# Patient Record
Sex: Female | Born: 1960 | Race: White | Hispanic: No | Marital: Married | Smoking: Never smoker
Health system: Southern US, Community
[De-identification: ages and names within clinical notes are randomized; demographics above are authoritative.]

## PROBLEM LIST (undated history)

## (undated) DIAGNOSIS — R350 Frequency of micturition: Secondary | ICD-10-CM

## (undated) DIAGNOSIS — H109 Unspecified conjunctivitis: Secondary | ICD-10-CM

## (undated) DIAGNOSIS — E559 Vitamin D deficiency, unspecified: Principal | ICD-10-CM

## (undated) DIAGNOSIS — M8080XD Other osteoporosis with current pathological fracture, unspecified site, subsequent encounter for fracture with routine healing: Secondary | ICD-10-CM

## (undated) DIAGNOSIS — R300 Dysuria: Secondary | ICD-10-CM

## (undated) DIAGNOSIS — K529 Noninfective gastroenteritis and colitis, unspecified: Secondary | ICD-10-CM

## (undated) DIAGNOSIS — B3731 Acute candidiasis of vulva and vagina: Secondary | ICD-10-CM

## (undated) DIAGNOSIS — Z23 Encounter for immunization: Principal | ICD-10-CM

## (undated) DIAGNOSIS — Z803 Family history of malignant neoplasm of breast: Principal | ICD-10-CM

## (undated) DIAGNOSIS — C801 Malignant (primary) neoplasm, unspecified: Secondary | ICD-10-CM

## (undated) DIAGNOSIS — T7840XA Allergy, unspecified, initial encounter: Secondary | ICD-10-CM

## (undated) DIAGNOSIS — K5900 Constipation, unspecified: Secondary | ICD-10-CM

## (undated) DIAGNOSIS — M8580 Other specified disorders of bone density and structure, unspecified site: Secondary | ICD-10-CM

## (undated) DIAGNOSIS — D649 Anemia, unspecified: Secondary | ICD-10-CM

## (undated) HISTORY — PX: COLONOSCOPY: SHX174

## (undated) HISTORY — DX: Malignant (primary) neoplasm, unspecified: C80.1

## (undated) HISTORY — DX: Other specified disorders of bone density and structure, unspecified site: M85.80

## (undated) HISTORY — DX: Constipation, unspecified: K59.00

## (undated) HISTORY — DX: Allergy, unspecified, initial encounter: T78.40XA

## (undated) HISTORY — PX: WISDOM TOOTH EXTRACTION: SHX21

## (undated) HISTORY — PX: OTHER SURGICAL HISTORY: SHX169

## (undated) HISTORY — DX: Anemia, unspecified: D64.9

---

## 2016-12-16 ENCOUNTER — Other Ambulatory Visit: Payer: Self-pay | Admitting: Physician Assistant

## 2016-12-16 DIAGNOSIS — Z1231 Encounter for screening mammogram for malignant neoplasm of breast: Secondary | ICD-10-CM

## 2016-12-16 DIAGNOSIS — M810 Age-related osteoporosis without current pathological fracture: Secondary | ICD-10-CM

## 2017-01-02 ENCOUNTER — Ambulatory Visit
Admission: RE | Admit: 2017-01-02 | Discharge: 2017-01-02 | Disposition: A | Discharge status: Home / Self Care | Payer: BLUE CROSS/BLUE SHIELD | Source: Ambulatory Visit | Attending: Physician Assistant | Admitting: Physician Assistant

## 2017-01-02 DIAGNOSIS — Z1231 Encounter for screening mammogram for malignant neoplasm of breast: Secondary | ICD-10-CM

## 2017-01-02 DIAGNOSIS — M810 Age-related osteoporosis without current pathological fracture: Secondary | ICD-10-CM

## 2018-11-05 ENCOUNTER — Other Ambulatory Visit (HOSPITAL_COMMUNITY): Payer: Self-pay | Admitting: Physician Assistant

## 2018-11-05 ENCOUNTER — Other Ambulatory Visit: Payer: Self-pay | Admitting: Physician Assistant

## 2018-11-05 DIAGNOSIS — R1032 Left lower quadrant pain: Secondary | ICD-10-CM

## 2018-11-05 DIAGNOSIS — R1011 Right upper quadrant pain: Secondary | ICD-10-CM

## 2018-11-05 DIAGNOSIS — R197 Diarrhea, unspecified: Secondary | ICD-10-CM

## 2018-11-09 ENCOUNTER — Other Ambulatory Visit (HOSPITAL_COMMUNITY): Payer: Self-pay | Admitting: Physician Assistant

## 2018-11-09 DIAGNOSIS — R197 Diarrhea, unspecified: Secondary | ICD-10-CM

## 2018-11-12 ENCOUNTER — Ambulatory Visit (HOSPITAL_COMMUNITY)
Admission: RE | Admit: 2018-11-12 | Discharge: 2018-11-12 | Disposition: A | Discharge status: Home / Self Care | Payer: BLUE CROSS/BLUE SHIELD | Source: Ambulatory Visit | Attending: Physician Assistant | Admitting: Physician Assistant

## 2018-11-12 ENCOUNTER — Other Ambulatory Visit: Payer: Self-pay

## 2018-11-12 DIAGNOSIS — R197 Diarrhea, unspecified: Secondary | ICD-10-CM | POA: Diagnosis present

## 2019-01-31 ENCOUNTER — Other Ambulatory Visit: Payer: Self-pay | Admitting: Obstetrics and Gynecology

## 2019-01-31 DIAGNOSIS — Z803 Family history of malignant neoplasm of breast: Secondary | ICD-10-CM

## 2019-02-02 ENCOUNTER — Ambulatory Visit
Admission: RE | Admit: 2019-02-02 | Discharge: 2019-02-02 | Disposition: A | Discharge status: Home / Self Care | Payer: BLUE CROSS/BLUE SHIELD | Source: Ambulatory Visit | Attending: Obstetrics and Gynecology | Admitting: Obstetrics and Gynecology

## 2019-02-02 DIAGNOSIS — Z803 Family history of malignant neoplasm of breast: Secondary | ICD-10-CM

## 2019-02-02 MED ORDER — GADOBUTROL 1 MMOL/ML IV SOLN
8.0000 mL | Freq: Once | INTRAVENOUS | Status: AC | PRN
  Administered 2019-02-02: 8 mL via INTRAVENOUS

## 2020-01-11 ENCOUNTER — Encounter: Payer: Self-pay | Admitting: Gastroenterology

## 2020-03-07 ENCOUNTER — Other Ambulatory Visit: Payer: Self-pay

## 2020-03-07 ENCOUNTER — Ambulatory Visit (AMBULATORY_SURGERY_CENTER): Payer: Self-pay | Admitting: *Deleted

## 2020-03-07 ENCOUNTER — Encounter: Payer: Self-pay | Admitting: Gastroenterology

## 2020-03-07 VITALS — Ht 65.0 in | Wt 205.0 lb

## 2020-03-07 DIAGNOSIS — Z1211 Encounter for screening for malignant neoplasm of colon: Secondary | ICD-10-CM

## 2020-03-07 NOTE — Progress Notes (Signed)
cov vax x 2  No egg or soy allergy known to patient  No issues with past sedation with any surgeries or procedures no intubation problems in the past  No FH of Malignant Hyperthermia No diet pills per patient No home 02 use per patient  No blood thinners per patient  Pt states issues with constipation  - long hx- has a BM EOD-  States are typically hard stools -nothing OTC to help - did a 2 day Miralax prep No A fib or A flutter  EMMI video to pt  COVID 19 guidelines implemented in [facility] today with Pt and RN    Due to the COVID-19 pandemic we are asking patients to follow these guidelines. Please only bring one care partner. Please be aware that your care partner may wait in the car in the parking lot or if they feel like they will be too hot to wait in the car, they may wait in the lobby on the 4th floor. All care partners are required to wear a mask the entire time (we do not have any that we can provide them), they need to practice social distancing, and we will do a Covid check for all patient's and care partners when you arrive. Also we will check their temperature and your temperature. If the care partner waits in their car they need to stay in the parking lot the entire time and we will call them on their cell phone when the patient is ready for discharge so they can bring the car to the front of the building. Also all patient's will need to wear a mask into building.

## 2020-03-21 ENCOUNTER — Ambulatory Visit (AMBULATORY_SURGERY_CENTER): Payer: BC Managed Care – PPO | Admitting: Gastroenterology

## 2020-03-21 ENCOUNTER — Encounter: Payer: Self-pay | Admitting: Gastroenterology

## 2020-03-21 ENCOUNTER — Other Ambulatory Visit: Payer: Self-pay

## 2020-03-21 VITALS — BP 103/82 | HR 69 | Temp 97.5°F | Resp 8 | Ht 65.0 in | Wt 205.0 lb

## 2020-03-21 DIAGNOSIS — Z1211 Encounter for screening for malignant neoplasm of colon: Secondary | ICD-10-CM

## 2020-03-21 MED ORDER — SODIUM CHLORIDE 0.9 % IV SOLN: 500.0000 mL | Freq: Once | INTRAVENOUS | Status: DC

## 2020-03-21 NOTE — Progress Notes (Signed)
PT taken to PACU. Monitors in place. VSS. Report given to RN. 

## 2020-03-21 NOTE — Patient Instructions (Signed)

## 2020-03-21 NOTE — Progress Notes (Signed)
Pt's states no medical or surgical changes since previsit or office visit. 

## 2020-03-21 NOTE — Op Note (Signed)
[facility] Patient Name: Heidi Watkins Procedure Date: 03/21/2020 8:04 AM MRN: [id number] Endoscopist: Milus Banister , MD Age: 59 Referring MD:  Date of Birth: [birth year] Gender: Female Account #: [id number] Procedure:                Colonoscopy Indications:              Screening for colorectal malignant neoplasm Medicines:                Monitored Anesthesia Care Procedure:                Pre-Anesthesia Assessment:                           - Prior to the procedure, a History and Physical                            was performed, and patient medications and                            allergies were reviewed. The patient's tolerance of                            previous anesthesia was also reviewed. The risks                            and benefits of the procedure and the sedation                            options and risks were discussed with the patient.                            All questions were answered, and informed consent                            was obtained. Prior Anticoagulants: The patient has                            taken no previous anticoagulant or antiplatelet                            agents. ASA Grade Assessment: II - A patient with                            mild systemic disease. After reviewing the risks                            and benefits, the patient was deemed in                            satisfactory condition to undergo the procedure.                           After obtaining informed consent, the colonoscope  was passed under direct vision. Throughout the                            procedure, the patient's blood pressure, pulse, and                            oxygen saturations were monitored continuously. The                            Colonoscope was introduced through the anus and                            advanced to the the cecum, identified by                            appendiceal orifice and  ileocecal valve. The                            colonoscopy was performed without difficulty. The                            patient tolerated the procedure well. The quality                            of the bowel preparation was good. The ileocecal                            valve, appendiceal orifice, and rectum were                            photographed. Scope In: 8:07:52 AM Scope Out: 8:21:27 AM Scope Withdrawal Time: 0 hours 7 minutes 2 seconds  Total Procedure Duration: 0 hours 13 minutes 35 seconds  Findings:                 The entire examined colon appeared normal on direct                            and retroflexion views. Complications:            No immediate complications. Estimated blood loss:                            None. Estimated Blood Loss:     Estimated blood loss: none. Impression:               - The entire examined colon is normal on direct and                            retroflexion views.                           - No polyps or cancers. Recommendation:           - Patient has a contact number available for  emergencies. The signs and symptoms of potential                            delayed complications were discussed with the                            patient. Return to normal activities tomorrow.                            Written discharge instructions were provided to the                            patient.                           - Resume previous diet.                           - Continue present medications.                           - Repeat colonoscopy in 10 years for screening. Milus Banister, MD 03/21/2020 8:24:41 AM This report has been signed electronically.

## 2020-03-23 ENCOUNTER — Telehealth: Payer: Self-pay | Admitting: *Deleted

## 2020-03-23 NOTE — Telephone Encounter (Signed)
1. Have you developed a fever since your procedure? no  2.   Have you had an respiratory symptoms (SOB or cough) since your procedure? no  3.   Have you tested positive for COVID 19 since your procedure no  4.   Have you had any family members/close contacts diagnosed with the COVID 19 since your procedure?  no   If yes to any of these questions please route to Joylene John, RN and Joella Prince, RN Follow up Call-  Call back number 03/21/2020  Post procedure Call Back phone  # [phone]  Permission to leave phone message Yes  Some recent data might be hidden     Patient questions:  Do you have a fever, pain , or abdominal swelling? No. Pain Score  0 *  Have you tolerated food without any problems? Yes.    Have you been able to return to your normal activities? Yes.    Do you have any questions about your discharge instructions: Diet   No. Medications  No. Follow up visit  No.  Do you have questions or concerns about your Care? No.  Actions: * If pain score is 4 or above: No action needed, pain <4.

## 2021-10-23 ENCOUNTER — Ambulatory Visit
Admit: 2021-10-23 | Discharge: 2021-10-23 | Payer: PRIVATE HEALTH INSURANCE | Attending: Internal Medicine | Primary: Internal Medicine

## 2021-10-23 DIAGNOSIS — K5901 Slow transit constipation: Secondary | ICD-10-CM

## 2021-10-23 NOTE — Assessment & Plan Note (Signed)
She had a history of macroglossia.  Tongue currently looks fairly normal in size.  No glossitis noted.  Check a B12.

## 2021-10-23 NOTE — Assessment & Plan Note (Signed)
Discussed the importance of stool softeners and fiber.  Discussed natural options such as increasing fiber his fruit intake and prunes.  Can also use MiraLAX and Metamucil.  Discussed the importance of avoiding constipation to reduce the chances of diverticulosis/diverticulitis as well as internal hemorrhoids.

## 2021-10-23 NOTE — Progress Notes (Signed)
HPI   Chief Complaint  Chief Complaint   Patient presents with    New Patient     Her husband comes to see you as well. They just moved from [place], [place].     Referral - General     Would like a referral for a dermatologist, has had a HX of skin cancer & also has a red rash that appeared when she moved here.    Blood Work       HISTORY OF PRESENT ILLNESS  61 y.o. female presents today as new pt:  61 year old female presents today to establish as a new patient.  I also take care for her husband John Parrilla.  Patient has a strong family history of breast cancer with 2 sisters who had breast cancer as well as her mother.  Patient states she tested for BRCA 2020 -negative.    Last mammogram - 2020 seeing [facility].  She also had a breast MRI in 2020 that was normal.  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  She has numerous moles and spots that she was previously being evaluated and followed by dermatology.  She is also had a lesion on her right lower eyelid that was removed in the past seems to be reoccurring at this time.  Patient's only other concern at this time is continued weight gain.  She is trying to exercise on a regular basis and eat well but she still continued to gain weight.  She occasionally has constipation as well and intermittently uses Dulcolax.  We discussed using MiraLAX and/or fiber supplementation such as Metamucil.  She states in general she does well but she works as a Engineer, maintenance (IT) and during tax season she is noted to have increased constipation but it is likely related to decreased water intake.    Last GYN exam was in 2021 with a normal Pap smear.  Unclear if she was tested for HPV at that time.    Last colonoscopy in 2021.  10-year follow-up.  Assessment & Plan    ASSESSMENT AND PLAN    1. Slow transit constipation  2. 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 Vitamin deficiency  -     Vitamin D  25 Hydroxy; Future  4. Hyperglycemia  -     Hemoglobin A1C; Future  5. Other specified disorders of bone density and structure, other site  -     DEXA BONE DENSITY AXIAL SKELETON; Future  6. Colon cancer screening  7. Breast cancer screening by mammogram  -     MAM TOMO DIGITAL SCREEN BILATERAL; Future  8. Routine medical exam  -     TSH with Reflex; Future  -     Lipid Panel; Future  -     Comprehensive Metabolic Panel; Future  9. Urinary frequency  -     Urinalysis W/ Rflx Microscopic; Future  10. Rosacea  -     Amb External Referral To Dermatology  11. Other seborrheic keratosis  -     Amb External Referral To Dermatology  12. Eye exam, routine  -     External Referral To Optometry     Obtaining routine blood work for physical.  Will return in 6 months for her physical.    PHQ Scores 10/23/2021   PHQ2 Score 0   PHQ9 Score 0  Interpretation of Total Score Depression Severity: 1-4 = Minimal depression, 5-9 = Mild depression, 10-14 = Moderate depression, 15-19 = Moderately severe depression, 20-27 = Severe depression   The diagnoses and plan were discussed with the patient, who verbalizes understanding and agrees with plan.  All questions answered.      There are no Patient Instructions on file for this visit.    Active medications at end of Visit  No current outpatient medications on file.     No current facility-administered medications for this visit.          No follow-ups on file.       REVIEW OF SYSTEMS  Review of Systems   Constitutional:  Positive for fatigue and unexpected weight change (wt gain).   HENT:  Negative for nosebleeds, postnasal drip and rhinorrhea.    Respiratory:  Negative for chest tightness and shortness of breath.    Gastrointestinal:  Positive for constipation.   Endocrine: Negative for cold intolerance.   Genitourinary:  Negative for dysuria.   Neurological:  Negative for dizziness and syncope.   Psychiatric/Behavioral:  Negative for agitation, behavioral problems and confusion.        PHYSICAL EXAMINATION  Vitals:    10/23/21 1509 10/23/21 1545   BP: (!) 144/83 130/84   Pulse: 60    SpO2: 97%    Weight: 210 lb 9.6 oz (95.5 kg)    Height: 5' 5"  (1.651 m)      Body mass index is 35.05 kg/m.    Physical Exam  Constitutional:       Appearance: Normal appearance.   HENT:      Head: Normocephalic and atraumatic.      Right Ear: Tympanic membrane and external ear normal.      Left Ear: Tympanic membrane and external ear normal.      Nose: Nose normal.      Mouth/Throat:      Mouth: Mucous membranes are moist.      Pharynx: Oropharynx is clear.   Eyes:      Conjunctiva/sclera: Conjunctivae normal.      Pupils: Pupils are equal, round, and reactive to light.   Cardiovascular:      Rate and Rhythm: Normal rate and regular rhythm.      Pulses: Normal pulses.   Pulmonary:      Effort: Pulmonary effort is normal.      Breath sounds: Normal breath sounds.   Abdominal:      General: Abdomen is flat. Bowel sounds are normal.      Palpations: Abdomen is soft.   Musculoskeletal:         General: Normal range of motion.      Cervical back: Normal range of motion.      Right lower leg: No edema.      Left lower leg: No edema.   Skin:     General: Skin is warm and dry.      Capillary Refill: Capillary refill takes less than 2 seconds.   Neurological:      General: No focal deficit present.      Mental Status: She is alert and oriented to person, place, and time. Mental status is at baseline.   Psychiatric:         Mood and Affect: Mood normal.         Behavior: Behavior normal.         Thought Content: Thought content normal.       MEDICATIONS at [facility]  of Visit  No current outpatient medications on file prior to visit.     No current facility-administered medications on file prior to visit.       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       PAST SURGICAL HISTORY  Past Surgical History:   Procedure Laterality Date    MOHS SURGERY  2018       FAMILY HISTORY  Family History   Problem Relation Age of Onset    Breast Cancer Mother 36    Macular Degen Father     Diabetes type 2  Father     Heart Failure Father     Breast Cancer Sister 46    Breast Cancer Sister 58    Bone Cancer Maternal Grandfather     Cerebral Aneurysm Paternal Grandmother        SOCIAL HISTORY  Social History     Socioeconomic History    Marital status: Married     Spouse name: None    Number of children: None    Years of education: None    Highest education level: None   Tobacco Use    Smoking status: Never    Smokeless tobacco: Never   Vaping Use    Vaping Use: Never used   Substance and Sexual Activity    Alcohol use: Not Currently    Drug use: Not Currently    Sexual activity: Yes     Partners: Male

## 2021-10-23 NOTE — Assessment & Plan Note (Signed)
Referral to dermatology for follow-up.  Has only had 1 skin cancer removed.  Also has had a lesion under her right eyelid removed.

## 2021-10-25 ENCOUNTER — Encounter

## 2021-10-25 LAB — CBC
Hematocrit: 39.5 % (ref 34.0–47.0)
Hemoglobin: 13.3 g/dL (ref 11.5–15.7)
MCH: 31.9 pg (ref 27.0–34.5)
MCHC: 33.7 g/dL (ref 32.0–36.0)
MCV: 94.7 fL (ref 81.0–99.0)
MPV: 11.8 fL (ref 7.2–13.2)
NRBC Absolute: 0 10*3/uL (ref 0.000–0.012)
NRBC Automated: 0 % (ref 0.0–0.2)
Platelets: 246 10*3/uL (ref 140–440)
RBC: 4.17 x10e6/mcL (ref 3.60–5.20)
RDW: 13.4 % (ref 11.0–16.0)
WBC: 5.1 10*3/uL (ref 3.8–10.6)

## 2021-10-25 LAB — URINALYSIS W/ RFLX MICROSCOPIC
Bilirubin Urine: NEGATIVE
Blood, Urine: NEGATIVE
Glucose, UA: NEGATIVE
Ketones, Urine: NEGATIVE
Leukocyte Esterase, Urine: NEGATIVE
Nitrite, Urine: NEGATIVE
Protein, UA: NEGATIVE
Specific Gravity, UA: 1.015 (ref 1.003–1.035)
Urobilinogen, Urine: 0.2 EU/dL
pH, UA: 7 (ref 4.5–8.0)

## 2021-10-25 LAB — LIPID PANEL
Chol/HDL Ratio: 3.2 (ref 0.0–4.4)
Cholesterol: 193 mg/dL (ref 100–200)
HDL: 60 mg/dL (ref >=50)
LDL Cholesterol: 124.2 mg/dL — ABNORMAL HIGH (ref 0.0–100.0)
LDL/HDL Ratio: 2.1
Triglycerides: 44 mg/dL (ref 0–149)
VLDL: 8.8 mg/dL (ref 5.0–40.0)

## 2021-10-25 LAB — COMPREHENSIVE METABOLIC PANEL
ALT: 17 U/L (ref 0–35)
AST: 18 U/L (ref 0–35)
Albumin/Globulin Ratio: 1.4 (ref 1.00–2.70)
Albumin: 3.8 g/dL (ref 3.5–5.2)
Alk Phosphatase: 93 U/L (ref 35–117)
Anion Gap: 9 mmol/L (ref 2–17)
BUN: 11 mg/dL (ref 8–23)
CO2: 24 mmol/L (ref 22–29)
Calcium: 9 mg/dL (ref 8.8–10.2)
Chloride: 108 mmol/L — ABNORMAL HIGH (ref 98–107)
Creatinine: 0.6 mg/dL (ref 0.5–1.0)
Est, Glom Filt Rate: 101 mL/min/1.73m (ref >=60)
Globulin: 2.7 g/dL (ref 1.9–4.4)
Glucose: 100 mg/dL — ABNORMAL HIGH (ref 70–99)
OSMOLALITY CALCULATED: 281 mOsm/kg (ref 270–287)
Potassium: 4.2 mmol/L (ref 3.5–5.3)
Sodium: 141 mmol/L (ref 135–145)
Total Bilirubin: 0.3 mg/dL (ref 0.00–1.20)
Total Protein: 6.5 g/dL (ref 6.4–8.3)

## 2021-10-25 LAB — HEMOGLOBIN A1C
Est. Avg. Glucose, WB: 114
Est. Avg. Glucose-calculated: 122
Hemoglobin A1C: 5.6 % (ref 4.0–6.0)

## 2021-10-25 LAB — VITAMIN D 25 HYDROXY: Vit D, 25-Hydroxy: 26 ng/mL — ABNORMAL LOW (ref 30.0–90.0)

## 2021-10-25 LAB — TSH WITH REFLEX: TSH: 1.35 mcIU/mL (ref 0.358–3.740)

## 2021-10-28 NOTE — Other (Signed)
Please update patient.    Labs overall look good.  LDL minimally elevated.  Continue with a heart healthy diet.  Recommend starting vitamin D supplementation as listed below.  CMP: Within normal limits.  Lipids: Within normal limits.  A great HDL at 60 and a stable LDL at 124.  Vitamin D level is low.  Recommend vitamin D 2000 units or 50 mcg daily.  TSH within normal limits  A1c within normal limits  UA negative.  CBC within normal limits

## 2021-11-04 ENCOUNTER — Ambulatory Visit
Admit: 2021-11-04 | Discharge: 2021-11-04 | Payer: PRIVATE HEALTH INSURANCE | Attending: Family | Primary: Internal Medicine

## 2021-11-04 DIAGNOSIS — S3992XA Unspecified injury of lower back, initial encounter: Secondary | ICD-10-CM

## 2021-11-04 MED ORDER — PREDNISONE 10 MG PO TABS
10 MG | ORAL_TABLET | ORAL | 0 refills | Status: AC
Start: 2021-11-04 — End: 2021-11-16

## 2021-11-04 MED ORDER — PREDNISONE 10 MG PO TABS
10 MG | ORAL_TABLET | ORAL | 0 refills | Status: DC
Start: 2021-11-04 — End: 2021-11-04

## 2021-11-04 NOTE — Progress Notes (Unsigned)
CHIEF COMPLAINT:  Chief Complaint   Patient presents with    Lower Back Pain     Patient presents today with complaints of low back pain after trying to straighten the leg of a lounge chair and hearing a pop 1 week ago.        HISTORY OF PRESENT ILLNESS:  Julie Garner is a 61 y.o. female  who presents   Aleve, back brace, rest, ice once  Chiropractor in [place], 3 years ago had imaging, scoliosis.     Back Pain  This is a new problem. The current episode started in the past 7 days. The problem occurs constantly. The problem is unchanged. The pain is present in the lumbar spine. The quality of the pain is described as aching. The pain does not radiate. The pain is moderate. The pain is The same all the time. The symptoms are aggravated by twisting. Stiffness is present All day. Associated symptoms include abdominal pain. Pertinent negatives include no bladder incontinence, bowel incontinence, chest pain, dysuria, fever, headaches, leg pain, numbness, pelvic pain, tingling, weakness or weight loss. Risk factors include menopause and history of osteoporosis. She has tried bed rest and NSAIDs (back brace) for the symptoms. The treatment provided moderate relief.      PHQ:  PHQ-9  10/23/2021   Little interest or pleasure in doing things 0   Feeling down, depressed, or hopeless 0   PHQ-2 Score 0   PHQ-9 Total Score 0       CURRENT MEDICATION LIST:    Prior to Admission medications    Medication Sig Start Date End Date Taking? Authorizing Provider   Cholecalciferol (VITAMIN D3) 50 MCG (2000 UT) CAPS Take by mouth   Yes Historical Provider, MD   metroNIDAZOLE (METROCREAM) 0.75 % cream Apply TO affected AREA 1-2 times a DAY 10/28/21   Historical Provider, MD   doxycycline hyclate (VIBRA-TABS) 100 MG tablet Take 1/2 tablet BY MOUTH with food EVERY DAY 10/28/21   Historical Provider, MD   predniSONE (DELTASONE) 10 MG tablet Take 4 tablets by mouth daily for 3 days, THEN 3 tablets daily for 3 days, THEN 2 tablets daily for 3 days,  THEN 1 tablet daily for 3 days. 11/04/21 11/16/21 Yes [place], APRN - NP           I have reviewed and reconciled the above medication list during today's office visit.     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      Past Surgical History:   Procedure Laterality Date    MOHS SURGERY  2018      Family History   Problem Relation Age of Onset    Breast Cancer Mother 56    Macular Degen Father     Diabetes type 2  Father     Heart Failure Father     Breast Cancer Sister 21    Breast Cancer Sister 39    Bone Cancer Maternal Grandfather     Cerebral Aneurysm Paternal Grandmother       Social History     Socioeconomic History    Marital status: Married     Spouse name: Not on file    Number of children: Not on file  Years of education: Not on file    Highest education level: Not on file   Occupational History    Not on file   Tobacco Use    Smoking status: Never    Smokeless tobacco: Never   Vaping Use    Vaping Use: Never used   Substance and Sexual Activity    Alcohol use: Not Currently    Drug use: Not Currently    Sexual activity: Yes     Partners: Male   Other Topics Concern    Not on file   Social History Narrative    Not on file     Social Determinants of Health     Financial Resource Strain: Not on file   Food Insecurity: Not on file   Transportation Needs: Not on file   Physical Activity: Not on file   Stress: Not on file   Social Connections: Not on file   Intimate Partner Violence: Not on file   Housing Stability: Not on file        REVIEW OF SYSTEMS:  Review of Systems   Constitutional:  Negative for fever and weight loss.   Cardiovascular:  Negative for chest pain.   Gastrointestinal:  Positive for abdominal pain. Negative for bowel incontinence.   Genitourinary:  Negative for bladder incontinence, dysuria and pelvic  pain.   Musculoskeletal:  Positive for back pain.   Neurological:  Negative for tingling, weakness, numbness and headaches.      PHYSICAL EXAM:    Vital Signs -   Vitals:    11/04/21 1632   BP: 139/73   Pulse: 69   Temp: 98.1 F (36.7 C)   SpO2: 98%   Weight: 203 lb (92.1 kg)   Height: 5' 5"  (1.651 m)        Physical Exam  Vitals reviewed.   Constitutional:       Appearance: Normal appearance.   HENT:      Head: Normocephalic and atraumatic.      Nose: Nose normal.      Mouth/Throat:      Mouth: Mucous membranes are moist.   Eyes:      Conjunctiva/sclera: Conjunctivae normal.   Cardiovascular:      Rate and Rhythm: Normal rate and regular rhythm.      Pulses: Normal pulses.      Heart sounds: Normal heart sounds.   Pulmonary:      Effort: Pulmonary effort is normal.      Breath sounds: Normal breath sounds.   Abdominal:      Palpations: Abdomen is soft.   Musculoskeletal:         General: Tenderness present.      Cervical back: Neck supple.      Lumbar back: Tenderness present. Decreased range of motion.   Skin:     General: Skin is warm.   Neurological:      Mental Status: She is alert and oriented to person, place, and time.   Psychiatric:         Mood and Affect: Mood normal.         Behavior: Behavior normal.        LABS  No results found for this visit on 11/04/21.  Orders Only on 10/25/2021   Component Date Value Ref Range Status    Color, UA 10/25/2021 Yellow   Final    Appearance 10/25/2021 Clear   Final    Glucose, UA 10/25/2021 Negative  Negative Final    Bilirubin  Urine 10/25/2021 Negative  Negative Final    Ketones, Urine 10/25/2021 Negative  Negative Final    Specific Gravity, UA 10/25/2021 1.015  1.003 - 1.035 Final    Blood, Urine 10/25/2021 Negative  Negative Final    pH, UA 10/25/2021 7.0  4.5 - 8.0 Final    Protein, UA 10/25/2021 Negative  Negative Final    Urobilinogen, Urine 10/25/2021 0.2  1.0 EU/dL Final    Nitrite, Urine 10/25/2021 Negative  Negative Final    Leukocyte Esterase, Urine  10/25/2021 Negative  Negative Final    Vit D, 25-Hydroxy 10/25/2021 26.0 (L)  30.0 - 90.0 ng/mL Final    Hemoglobin A1C 10/25/2021 5.6  4.0 - 6.0 % Final    Comment: HEMOGLOBIN A1C INTERPRETATION:    The following arbitrary ranges may be used for interpretation of the results.  However, factors such as duration of diabetes, adherence to therapy, and  patient age should also be considered in assessing degree of blood glucose  control.    Hemoglobin A1C                 Avg. Blood Sugar  --------------------------------------------------------------  6%                           135 mg/dL  7%                           170 mg/dL  8%                           205 mg/dL  9%                           240 mg/dL  10%                          275 mg/dL    ======================================================    A1C                      Glucose Control  ----------------------------------------------------------------  < 6.0 %                   Normal  6.0 - 6.9 %               Abnormal  7.0 - 7.9 %               Sub-Optimal Control  > 8.0 %                   Inadequate Control      Est. Avg. Glucose, WB 10/25/2021 114   Final    Est. Avg. Glucose-calculated 10/25/2021 122   Final    WBC 10/25/2021 5.1  3.8 - 10.6 x10e3/mcL Final    RBC 10/25/2021 4.17  3.60 - 5.20 x10e6/mcL Final    Hemoglobin 10/25/2021 13.3  11.5 - 15.7 g/dL Final    Hematocrit 10/25/2021 39.5  34.0 - 47.0 % Final    MCV 10/25/2021 94.7  81.0 - 99.0 fL Final    MCH 10/25/2021 31.9  27.0 - 34.5 pg Final    MCHC 10/25/2021 33.7  32.0 - 36.0 g/dL Final    RDW 10/25/2021 13.4  11.0 - 16.0 % Final    Platelets 10/25/2021 246  140 - 440 x10e3/mcL Final  MPV 10/25/2021 11.8  7.2 - 13.2 fL Final    NRBC Automated 10/25/2021 0.0  0.0 - 0.2 % Final    NRBC Absolute 10/25/2021 0.000  0.000 - 0.012 x10e3/mcL Final    Sodium 10/25/2021 141  135 - 145 mmol/L Final    Potassium 10/25/2021 4.2  3.5 - 5.3 mmol/L Final    Chloride 10/25/2021 108 (H)  98 - 107 mmol/L Final     CO2 10/25/2021 24  22 - 29 mmol/L Final    Glucose 10/25/2021 100 (H)  70 - 99 mg/dL Final    BUN 10/25/2021 11  8 - 23 mg/dL Final    Creatinine 10/25/2021 0.6  0.5 - 1.0 mg/dL Final    Anion Gap 10/25/2021 9  2 - 17 mmol/L Final    OSMOLALITY CALCULATED 10/25/2021 281  270 - 287 mOsm/kg Final    Calcium 10/25/2021 9.0  8.8 - 10.2 mg/dL Final    Total Protein 10/25/2021 6.5  6.4 - 8.3 g/dL Final    Albumin 10/25/2021 3.8  3.5 - 5.2 g/dL Final    Globulin 10/25/2021 2.7  1.9 - 4.4 g/dL Final    Albumin/Globulin Ratio 10/25/2021 1.40  1.00 - 2.70 Final    Total Bilirubin 10/25/2021 0.30  0.00 - 1.20 mg/dL Final    Alk Phosphatase 10/25/2021 93  35 - 117 unit/L Final    AST 10/25/2021 18  0 - 35 unit/L Final    ALT 10/25/2021 17  0 - 35 unit/L Final    Est, Glom Filt Rate 10/25/2021 101  >=60 mL/min/1.56mFinal    Comment: VERIFIED by Discern Expert.  GFR Interpretation:                                                                         % OF  KIDNEY  GFR                                                        STAGE  FUNCTION  ==================================================================================    > 90        Normal kidney function                       STAGE 1  90-100%  89 to 60      Mild loss of kidney function                 STAGE 2  80-60%  59 to 45      Mild to moderate loss of kidney function     STAGE 3a  59-45%  44 to 30      Moderate to severe loss of kidney function   STAGE 3b  44-30%  29 to 15      Severe loss of kidney function               STAGE 4  29-15%    < 15        Kidney failure  STAGE 5  <15%  ==================================================================================  Modified from [place]    GFR Calculation performed using the CKD-EPI 2021 equation developed for use  with IDMS traceable creatinine methods and                            is the calculation recommended by  the [place] for estimating GFR  in adults.      Cholesterol 10/25/2021 193  100 - 200 mg/dL Final    Comment: The National Cholesterol Education Program has published reference  cholesterol values for cardiovascular risk to be:    Less than 200 mg/dL     = Low Risk    200 to 239 mg/dL        = Borderline Risk    241m/dL and greater    = High Risk      HDL 10/25/2021 60  >=50 mg/dL Final    Comment: The National Lipid Association and the [employer]olesterol Education Program  (NCEP) have set the guidelines for high-density lipoprotein (HDL) cholesterol  in adults ages 120and up.      Triglycerides 10/25/2021 44  0 - 149 mg/dL Final    Comment:   TRIGLYCERIDE INTERPRETATION:                          Recommended Fasting Triglyc Levels for Adults                        =============================================                        Desirable                        < 150 mg/dL                        Average                          < 200 mg/dL                        Borderline High             200 to 500 mg/dL                        Hypertriglyceridemic             > 500 mg/dL                        =============================================      LDL Cholesterol 10/25/2021 124.2 (H)  0.0 - 100.0 mg/dL Final    LDL/HDL Ratio 10/25/2021 2.1   Final    Chol/HDL Ratio 10/25/2021 3.2  0.0 - 4.4 Final    VLDL 10/25/2021 8.8  5.0 - 40.0 mg/dL Final    TSH 10/25/2021 1.350  0.358 - 3.740 mcIU/mL Final    Comment: TSH INTERPRETATION:    Controversy exists over an acceptable TSH range. Some experts argue that a  narrower reference range is better and will increase the detection of thyroid  disease, particularly marginal hypothyroidism.         I have reviewed the above labs with the patient during today's  office visit.    IMPRESSION/PLAN    1. Back injury, initial encounter  -     XR LUMBAR SPINE (2-3 VIEWS); Future  -     predniSONE (DELTASONE) 10 MG tablet; Take 4 tablets by mouth daily for 3 days, THEN 3 tablets daily for 3 days, THEN 2 tablets  daily for 3 days, THEN 1 tablet daily for 3 days., Disp-30 tablet, R-0Normal  2. Acute bilateral low back pain without sciatica  -     XR LUMBAR SPINE (2-3 VIEWS); Future  -     predniSONE (DELTASONE) 10 MG tablet; Take 4 tablets by mouth daily for 3 days, THEN 3 tablets daily for 3 days, THEN 2 tablets daily for 3 days, THEN 1 tablet daily for 3 days., Disp-30 tablet, R-0Normal     The patient is advised to apply ice or cold packs intermittently as needed to relieve pain.       Follow up and Dispositions:  Return if symptoms worsen or fail to improve.       Jodie Echevaria, FNP-C

## 2021-11-04 NOTE — Telephone Encounter (Signed)
Julie Garner

## 2021-11-04 NOTE — Telephone Encounter (Addendum)
Patient states that she hurt the lower part of her back a week ago from Saturday. She states that it popped while she was turning a lounge chair over with her foot. She states that it didn't hurt right then, but it did ever since that day. She's been taking aleve. Please call and advise     On board for Dr.thompson and sent to [facility]

## 2021-11-05 ENCOUNTER — Ambulatory Visit: Admit: 2021-11-05 | Discharge: 2021-11-05 | Payer: PRIVATE HEALTH INSURANCE | Primary: Internal Medicine

## 2021-11-05 DIAGNOSIS — S3992XA Unspecified injury of lower back, initial encounter: Secondary | ICD-10-CM

## 2021-11-15 NOTE — Telephone Encounter (Signed)
Looks like Julie Garner ordered an XR of her LS spine, and informed her of the L1 compression deformity. Then put in a referral to [facility] for her.     I called to see if she had heard from their office, she had not and I gave her the office number for Dr [facility]'s office. She will call, let them know she has a referral and see about getting scheduled.

## 2021-11-15 NOTE — Telephone Encounter (Signed)
Patient has had some back problems and was diagnosed with having an L1 compression deformity. She would like to know if a referral can be put in for her to a specialty doctor who can help her.     On board for Dr. Harvel Quale to [facility]

## 2021-12-03 ENCOUNTER — Inpatient Hospital Stay: Admit: 2021-12-03 | Payer: PRIVATE HEALTH INSURANCE | Primary: Internal Medicine

## 2021-12-03 DIAGNOSIS — Z1231 Encounter for screening mammogram for malignant neoplasm of breast: Secondary | ICD-10-CM

## 2021-12-03 DIAGNOSIS — M8588 Other specified disorders of bone density and structure, other site: Secondary | ICD-10-CM

## 2021-12-03 NOTE — Other (Signed)
Please call update and schedule the patient.      Her bone density test shows significant osteopenia however with her recent L1 compression fracture I think she needs to be treated for osteoporosis.  We can schedule her for a follow-up visit to discuss treatment options.  Also need to check a vitamin D level on her.  She will need vitamin D and calcium supplementation likely.

## 2021-12-04 ENCOUNTER — Encounter

## 2021-12-06 ENCOUNTER — Encounter

## 2021-12-06 LAB — VITAMIN D 25 HYDROXY: Vit D, 25-Hydroxy: 27.4 ng/mL — ABNORMAL LOW (ref 30.0–90.0)

## 2021-12-09 ENCOUNTER — Ambulatory Visit
Admit: 2021-12-09 | Discharge: 2021-12-09 | Payer: PRIVATE HEALTH INSURANCE | Attending: Internal Medicine | Primary: Internal Medicine

## 2021-12-09 DIAGNOSIS — M8080XD Other osteoporosis with current pathological fracture, unspecified site, subsequent encounter for fracture with routine healing: Secondary | ICD-10-CM

## 2021-12-09 MED ORDER — CALCIUM CARBONATE-VITAMIN D 500-5 MG-MCG PO TABS
500-5 | ORAL_TABLET | Freq: Every day | ORAL | 3 refills | 30.00000 days | Status: DC
Start: 2021-12-09 — End: 2024-07-22

## 2021-12-09 MED ORDER — VITAMIN D3 50 MCG (2000 UT) PO CAPS
50 | ORAL_CAPSULE | Freq: Every day | ORAL | 3 refills | Status: DC
Start: 2021-12-09 — End: 2024-07-22

## 2021-12-09 MED ORDER — MAGNESIUM OXIDE 400 MG PO TABS
400 MG | ORAL_TABLET | Freq: Every day | ORAL | 3 refills | Status: DC
Start: 2021-12-09 — End: 2023-02-20

## 2021-12-09 MED ORDER — IBANDRONATE SODIUM 150 MG PO TABS
150 MG | ORAL_TABLET | ORAL | 3 refills | Status: AC
Start: 2021-12-09 — End: 2022-12-04

## 2021-12-09 NOTE — Progress Notes (Signed)
Mammo has been ordered.

## 2021-12-09 NOTE — Progress Notes (Unsigned)
Chief Complaint  Chief Complaint   Patient presents with    Osteoporosis       HISTORY OF PRESENT ILLNESS  61 y.o. female presents today for follow-up on:   Rosacea - abx  and cream.    Was on boniva 8- years ago.    Tolerated.    Had osteopenia  L1 fx April 29.   Pain is better now.   Wearing back brace for support.   [facility]  [facility].        HPI   Assessment & Plan    ASSESSMENT AND PLAN    1. Localized osteoporosis with current pathological fracture with routine healing, subsequent encounter  -     ibandronate (BONIVA) 150 MG tablet; Take 1 tablet by mouth every 30 days Take one (1) tablet once per month in the morning with a full glass of water, on an empty stomach, and do not take anything else by mouth or lie down for the next 60 minutes., Disp-3 tablet, R-3Normal  -     Cholecalciferol (VITAMIN D3) 50 MCG (2000 UT) CAPS; Take 2 capsules by mouth daily, Disp-180 capsule, R-3Normal  -     calcium-vitamin D (OSCAL-500) 500-5 MG-MCG TABS per tablet; Take 2 tablets by mouth daily, Disp-180 tablet, R-3Normal  -     magnesium oxide (MAG-OX) 400 MG tablet; Take 1 tablet by mouth daily, Disp-90 tablet, R-3Normal  2. Vitamin D deficiency  -     Cholecalciferol (VITAMIN D3) 50 MCG (2000 UT) CAPS; Take 2 capsules by mouth daily, Disp-180 capsule, R-3Normal  -     calcium-vitamin D (OSCAL-500) 500-5 MG-MCG TABS per tablet; Take 2 tablets by mouth daily, Disp-180 tablet, R-3Normal         PHQ Scores 10/23/2021   PHQ2 Score 0   PHQ9 Score 0     Interpretation of Total Score Depression Severity: 1-4 = Minimal depression, 5-9 = Mild depression, 10-14 = Moderate depression, 15-19 = Moderately severe depression, 20-27 = Severe depression   The diagnoses and plan were discussed with the patient, who verbalizes understanding and agrees with plan.  All questions answered.    {time statement optional (Optional):41590}  There are no Patient Instructions on file for this visit.    Active medications at end of  Visit  Current Outpatient Medications   Medication Sig Dispense Refill    ibandronate (BONIVA) 150 MG tablet Take 1 tablet by mouth every 30 days Take one (1) tablet once per month in the morning with a full glass of water, on an empty stomach, and do not take anything else by mouth or lie down for the next 60 minutes. 3 tablet 3    Cholecalciferol (VITAMIN D3) 50 MCG (2000 UT) CAPS Take 2 capsules by mouth daily 180 capsule 3    calcium-vitamin D (OSCAL-500) 500-5 MG-MCG TABS per tablet Take 2 tablets by mouth daily 180 tablet 3    magnesium oxide (MAG-OX) 400 MG tablet Take 1 tablet by mouth daily 90 tablet 3    metroNIDAZOLE (METROCREAM) 0.75 % cream Apply TO affected AREA 1-2 times a DAY      doxycycline hyclate (VIBRA-TABS) 100 MG tablet Take 0.5 tablets by mouth daily       No current facility-administered medications for this visit.          No follow-ups on file.       REVIEW OF SYSTEMS  Review of Systems     PHYSICAL EXAMINATION  Vitals:    12/09/21 0955   BP: 124/73   Pulse: 61   SpO2: 99%   Weight: 202 lb 9.6 oz (91.9 kg)   Height: 5\' 5"  (1.651 m)     Body mass index is 33.71 kg/m.  ***  Physical Exam    MEDICATIONS at [facility] of Visit  Current Outpatient Medications on File Prior to Visit   Medication Sig Dispense Refill    metroNIDAZOLE (METROCREAM) 0.75 % cream Apply TO affected AREA 1-2 times a DAY      doxycycline hyclate (VIBRA-TABS) 100 MG tablet Take 0.5 tablets by mouth daily       No current facility-administered medications on file prior to visit.       ALLERGIES / INTOLERANCES  No Known Allergies    PERTINENT LABS AND IMAGING  Lab Results   Component Value Date/Time    WBC 5.1 10/25/2021 08:45 AM    HGB 13.3 10/25/2021 08:45 AM    PLT 246 10/25/2021 08:45 AM    MCV 94.7 10/25/2021 08:45 AM

## 2022-01-22 ENCOUNTER — Ambulatory Visit
Admit: 2022-01-22 | Discharge: 2022-01-22 | Payer: PRIVATE HEALTH INSURANCE | Attending: Physician Assistant | Primary: Internal Medicine

## 2022-01-22 DIAGNOSIS — Z803 Family history of malignant neoplasm of breast: Secondary | ICD-10-CM

## 2022-01-22 NOTE — Progress Notes (Signed)
BREAST SURGERY NEW PATIENT VISIT    01/22/2022    CHIEF COMPLAINT:  High risk for future breast cancer.    HISTORY OF PRESENT ILLNESS:  Julie Garner is a 61 y.o. woman referred by Dr. Charlies Silvers who requested that I evaluate her for her elevated risk for future breast cancer development.  The patient's family history is significant in that her mother was diagnosed with breast cancer at 27 yo, sister diagnosed with breast cancer at 20 yo, sister diagnosed with breast cancer at 19 yo, maternal aunt diagnosed with breast cancer at 54 yo, and possible paternal aunts with breast cancer.   She presents today to discuss her elevated risk for breast cancer and the best mechanism for surveillance and/or prevention.  The patient states that she herself has not noticed any abnormal masses in either breast.  She denies any change in the appearance of her breasts or the skin of her breasts.  She denies bilateral nipple discharge.  She has no other systemic complaints and otherwise feels well today.     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 3 tablet 3    Cholecalciferol (VITAMIN D3) 50 MCG (2000 UT) CAPS Take 2 capsules by mouth daily 180 capsule 3    calcium-vitamin D (OSCAL-500) 500-5 MG-MCG TABS per tablet Take 2 tablets by mouth daily 180 tablet 3    magnesium oxide (MAG-OX) 400 MG tablet Take 1 tablet by mouth daily 90 tablet 3    metroNIDAZOLE (METROCREAM) 0.75 % cream Apply TO affected AREA 1-2 times a DAY      doxycycline hyclate (VIBRA-TABS) 100 MG tablet Take 0.5 tablets by mouth daily       No current facility-administered medications for this visit.     No Known Allergies  Social History     Socioeconomic History    Marital status: Married     Spouse name: Not on file    Number of children: Not on file    Years of education: Not on file    Highest education level: Not  on file   Occupational History    Not on file   Tobacco Use    Smoking status: Never    Smokeless tobacco: Never   Vaping Use    Vaping Use: Never used   Substance and Sexual Activity    Alcohol use: Not Currently    Drug use: Not Currently    Sexual activity: Yes     Partners: Male   Other Topics Concern    Not on file   Social History Narrative    Not on file     Social Determinants of Health     Financial Resource Strain: Not on file   Food Insecurity: Not on file   Transportation Needs: Not on file   Physical Activity: Not on file   Stress: Not on file   Social Connections: Not on file   Intimate Partner Violence: Not on file   Housing Stability: Not on file     Family History   Problem Relation Age of Onset    Breast Cancer Mother 31        deceased at 56 yo    [place]  Father     Diabetes type 2  Father     Heart Failure Father     Skin Cancer Father         [place]    Breast Cancer Sister 65        deceased at 105 yo    Breast Cancer Sister 31        ER+    Cancer Maternal Grandmother         unknown    Bone Cancer Maternal Grandfather     Cerebral Aneurysm Paternal Grandmother     Breast Cancer Maternal Aunt 48    Brain Cancer Paternal Uncle        Review of Systems    General/Constitutional:           Fever denies.  Chills denies.  Headache denies.  Fatigue denies.         Breast:           Breast lump denies.  Breast pain denies.  Nipple discharge denies.  Red skin denies.       Skin:           Rash denies.  Skin cancer denies.  Skin lesion(s) denies.           PHYSICAL EXAMINATION:   BP 126/75 (Site: Left Upper Arm, Position: Sitting, Cuff Size: Large Adult)   Pulse 61   Ht 5' 5"  (1.651 m)   Wt 200 lb (90.7 kg)   SpO2 98%   Breastfeeding No   BMI 33.28 kg/m   General Examination:         GENERAL APPEARANCE: well developed, well nourished, Patient is alert and oriented X 3. No acute distress. Appropriate affect, looks stated age.          HEAD: normocephalic, atraumatic.          NECK: neck supple, full range of motion.          LYMPH NODES:  no axillary, supraclavicular or cervical adenopathy.            BREASTS:  The patient was examined in the supine and upright positions.                    RIGHT Breast: the skin, nipple and areola are normal. No nipple discharge was able to be expressed  today. There are no retractions or dimpling noted. There is generalized nodularity without a dominant mass. The axillary tail is normal.                     LEFT Breast: the skin, nipple and areola are normal. No nipple discharge was able to be expressed today. There are no retractions or dimpling noted. There is generalized nodularity without a dominant mass. The axillary tail is normal.          SKIN: good turgor, no rashes noted, warm and dry.          MUSCULOSKELETAL: full range of  motion, no swelling or deformity.          EXTREMITIES: no edema, clubbing or cyanosis.      Radiology:  Mammogram Result (most recent):  MAM TOMO DIGITAL SCREEN BILATERAL 12/03/2021    Narrative  SCREENING MAMMOGRAM: 12/03/21    INDICATION: Z12.31,Encounter for screening mammogram for malignant neoplasm of  breast,ICD-10-CM.    COMPARISON: None available time of interpretation.    TECHNIQUE: BILATERAL 3D tomosynthesis and 2D digital mammography.    BREAST DENSITY: Scattered areas of fibroglandular density    FINDINGS:    Right: No suspicious masses, calcifications, or architectural distortion.    Left: No suspicious masses, calcifications, or architectural distortion.    Impression  No suspicious mammographic finding.    Of note, prior mammograms are unavailable for comparison purposes at the time of  this dictation. If obtained, an addendum may be issued.    OVERALL BIRADS: 1: Negative    RECOMMENDATION(S):  Bilateral Screening Mammogram in 1 year              A result letter will be sent to the patient.       [place] Result (most recent):  No results found for this or any previous visit from the past 3650 days.     GENETICS:      RISK ASSESSMENT:  TCV8    [facility] RISK APP      Assessment:    ICD-10-CM    1. Family history of breast cancer  Z80.3 MRI BREAST BILATERAL W WO CONTRAST      2. At high risk for breast cancer  Z91.89 MRI BREAST BILATERAL W WO CONTRAST           IMPRESSION/RECOMMENDATION:    Julie Garner is a 61 y.o. woman who presents today for consultation regarding her elevated risk for future breast cancer development.     The [employer] Risk app was utilized to calculate her breast cancer risk. Her five- year Baker Janus risk is 5.3% and her lifetime risk TCV 6 20.3%, TCV7 30.9%, TCV8 18.7%, BRCAPRO 14.1%, Claus 14.2%, Gail 22.0%.     At this time, she does meet criteria for chemoprevention using medications for risk reduction. We discussed that these numbers aren't stagnant and they will change as she ages. We can  recalculate her risk in about five years to update her numbers. She may be eligible for risk reduction medications at some point including Tamoxifen, Raloxifene, and aromatase inhibitors.  We discussed possible side effects of tamoxifen including hot flashes, endometrial cancer (however patient has had a hysterectomy) as well as VTE. She would like to consider this    With her LTR > 20% she does meet qualifications for high risk imaging surveillance to include annual mammogram, annual breast MRI and two CBEs annually. We discussed the false positives of breast MRI that require further  work up with targeted breast US with possible biopsy.     Summary:  To summarize, I recommend two breast exams per year, annual mammogram and annual MRI. She was instructed to call if she notes changes in her exam.     MRI - plan for bilateral breast MRI 05/2022 with CBE a few days later  Mammogram - plan for 11/2022  Patient will consider chemoprevention, call if she wants referral to medical oncology  Continue breast surveillance    I spent a total of 45 minutes reviewing the records, imaging studies, reports and counseling the patient regarding the recommended treatment plan for her breast care and documenting in patient record.     Wyline Beady, PA-C

## 2022-04-16 ENCOUNTER — Encounter
Admit: 2022-04-16 | Discharge: 2022-04-16 | Payer: PRIVATE HEALTH INSURANCE | Attending: Internal Medicine | Primary: Internal Medicine

## 2022-04-16 DIAGNOSIS — E559 Vitamin D deficiency, unspecified: Secondary | ICD-10-CM

## 2022-04-16 NOTE — Progress Notes (Signed)
Chief Complaint  Chief Complaint   Patient presents with    Follow-up       HISTORY OF PRESENT ILLNESS  61 y.o. female presents today for follow-up on:   Osteoporosis, rosacea, vitamin D deficiency.  Patient states overall she is doing well.  No new concerns at this time.  Tolerating Boniva without any side effects.  Continuing her calcium and vitamin D.  We discussed COVID-vaccine which are recommended.  Discussed RSV vaccine -recommended strongly for her husband stated for her that was her choice.  Expressed that I have no concerns regarding the COVID-vaccine or through the RSV vaccine.      HPI   Assessment & Plan      1. Vitamin D deficiency  Overview:  on vitamin D 4000 units daily.  Follow-up in 6 months  Orders:  -     Vitamin D 25 Hydroxy; Future  2. Localized osteoporosis with current pathological fracture with routine healing, subsequent encounter  Overview:  History of vertebral fracture.  DEXA scan shows osteopenia.  Repeat DEXA in June 2025  3. Rosacea  Overview:  Stable.  Continuing Flagyl cream and doxycycline 50 mg daily  4. Laboratory exam ordered as part of routine general medical examination  -     Comprehensive Metabolic Panel; Future  -     CBC with Auto Differential; Future  -     Urinalysis W/ Rflx Microscopic; Future  -     Lipid Panel; Future  -     Vitamin D 25 Hydroxy; Future  -     Hemoglobin A1C; Future  5. Hyperglycemia  -     Hemoglobin A1C; Future  6. Squamous cell cancer of skin of nose  Overview:  Removed in 2018  Followed by dermatology              10/23/2021     4:06 PM   PHQ Scores   PHQ2 Score 0   PHQ9 Score 0     Interpretation of Total Score Depression Severity: 1-4 = Minimal depression, 5-9 = Mild depression, 10-14 = Moderate depression, 15-19 = Moderately severe depression, 20-27 = Severe depression   The diagnoses and plan were discussed with the patient, who verbalizes understanding and agrees with plan.  All questions answered.      There are no Patient Instructions on  file for this visit.    Active medications at end of Visit  Current Outpatient Medications   Medication Sig Dispense Refill    ibandronate (BONIVA) 150 MG tablet Take 1 tablet by mouth every 30 days Take one (1) tablet once per month in the morning with a full glass of water, on an empty stomach, and do not take anything else by mouth or lie down for the next 60 minutes. 3 tablet 3    Cholecalciferol (VITAMIN D3) 50 MCG (2000 UT) CAPS Take 2 capsules by mouth daily 180 capsule 3    calcium-vitamin D (OSCAL-500) 500-5 MG-MCG TABS per tablet Take 2 tablets by mouth daily 180 tablet 3    magnesium oxide (MAG-OX) 400 MG tablet Take 1 tablet by mouth daily 90 tablet 3    metroNIDAZOLE (METROCREAM) 0.75 % cream Apply TO affected AREA 1-2 times a DAY      doxycycline hyclate (VIBRA-TABS) 100 MG tablet Take 0.5 tablets by mouth daily       No current facility-administered medications for this visit.          Return in about 6 months (  around 10/16/2022) for CPE.       REVIEW OF SYSTEMS  Review of Systems     PHYSICAL EXAMINATION  Vitals:    04/16/22 1404   BP: 118/82   Pulse: 68   SpO2: 98%   Weight: 89.8 kg (198 lb)   Height: 1.651 m (5\' 5" )     Body mass index is 32.95 kg/m.    Physical Exam  Vitals reviewed.   Constitutional:       Appearance: Normal appearance.   HENT:      Head: Normocephalic and atraumatic.   Cardiovascular:      Rate and Rhythm: Normal rate and regular rhythm.   Pulmonary:      Effort: Pulmonary effort is normal.      Breath sounds: Normal breath sounds.   Abdominal:      General: Bowel sounds are normal.      Palpations: Abdomen is soft.   Musculoskeletal:         General: Normal range of motion.      Cervical back: Normal range of motion.   Skin:     General: Skin is warm.   Neurological:      General: No focal deficit present.      Mental Status: She is alert and oriented to person, place, and time. Mental status is at baseline.   Psychiatric:         Mood and Affect: Mood normal.         Behavior:  Behavior normal.         Thought Content: Thought content normal.         MEDICATIONS at [facility] of Visit  Current Outpatient Medications on File Prior to Visit   Medication Sig Dispense Refill    ibandronate (BONIVA) 150 MG tablet Take 1 tablet by mouth every 30 days Take one (1) tablet once per month in the morning with a full glass of water, on an empty stomach, and do not take anything else by mouth or lie down for the next 60 minutes. 3 tablet 3    Cholecalciferol (VITAMIN D3) 50 MCG (2000 UT) CAPS Take 2 capsules by mouth daily 180 capsule 3    calcium-vitamin D (OSCAL-500) 500-5 MG-MCG TABS per tablet Take 2 tablets by mouth daily 180 tablet 3    magnesium oxide (MAG-OX) 400 MG tablet Take 1 tablet by mouth daily 90 tablet 3    metroNIDAZOLE (METROCREAM) 0.75 % cream Apply TO affected AREA 1-2 times a DAY      doxycycline hyclate (VIBRA-TABS) 100 MG tablet Take 0.5 tablets by mouth daily       No current facility-administered medications on file prior to visit.       ALLERGIES / INTOLERANCES  No Known Allergies    PERTINENT LABS AND IMAGING  Lab Results   Component Value Date/Time    WBC 5.1 10/25/2021 08:45 AM    HGB 13.3 10/25/2021 08:45 AM    PLT 246 10/25/2021 08:45 AM    MCV 94.7 10/25/2021 08:45 AM

## 2022-06-10 ENCOUNTER — Inpatient Hospital Stay: Admit: 2022-06-10 | Payer: PRIVATE HEALTH INSURANCE | Primary: Internal Medicine

## 2022-06-10 DIAGNOSIS — Z803 Family history of malignant neoplasm of breast: Secondary | ICD-10-CM

## 2022-06-10 MED ORDER — GADOTERIDOL 279.3 MG/ML IV SOLN
279.3 MG/ML | Freq: Once | INTRAVENOUS | Status: AC | PRN
Start: 2022-06-10 — End: 2022-06-10
  Administered 2022-06-10: 18:00:00 20 mL via INTRAVENOUS

## 2022-06-11 ENCOUNTER — Ambulatory Visit
Admit: 2022-06-11 | Discharge: 2022-06-11 | Payer: PRIVATE HEALTH INSURANCE | Attending: Physician Assistant | Primary: Internal Medicine

## 2022-06-11 DIAGNOSIS — Z803 Family history of malignant neoplasm of breast: Secondary | ICD-10-CM

## 2022-06-11 NOTE — Progress Notes (Signed)
BREAST SURGERY FOLLOW UP VISIT    01/22/2022    CHIEF COMPLAINT:  High risk for future breast cancer.    HISTORY OF PRESENT ILLNESS:  Julie Garner is a 61 y.o. presents for 6 month follow up. Patient was initially referred by Dr. Charlies Silvers who requested that I evaluate her for her elevated risk for future breast cancer development.  The patient's family history is significant in that her mother was diagnosed with breast cancer at 63 yo, sister diagnosed with breast cancer at 66 yo, sister diagnosed with breast cancer at 2 yo, maternal aunt diagnosed with breast cancer at 79 yo, and possible paternal aunts with breast cancer.   She presents today to discuss her elevated risk for breast cancer and the best mechanism for surveillance and/or prevention.  The patient states that she herself has not noticed any abnormal masses in either breast.  She denies any change in the appearance of her breasts or the skin of her breasts.  She denies bilateral nipple discharge.  She has no other systemic complaints and otherwise feels well today.     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 3 tablet 3    Cholecalciferol (VITAMIN D3) 50 MCG (2000 UT) CAPS Take 2 capsules by mouth daily 180 capsule 3    calcium-vitamin D (OSCAL-500) 500-5 MG-MCG TABS per tablet Take 2 tablets by mouth daily 180 tablet 3    magnesium oxide (MAG-OX) 400 MG tablet Take 1 tablet by mouth daily 90 tablet 3    metroNIDAZOLE (METROCREAM) 0.75 % cream Apply TO affected AREA 1-2 times a DAY      doxycycline hyclate (VIBRA-TABS) 100 MG tablet Take 0.5 tablets by mouth daily       No current facility-administered medications for this visit.     No Known Allergies  Social History     Socioeconomic History    Marital status: Married     Spouse name: Not on file    Number of children: Not on file    Years of education: Not  on file    Highest education level: Not on file   Occupational History    Not on file   Tobacco Use    Smoking status: Never    Smokeless tobacco: Never   Vaping Use    Vaping Use: Never used   Substance and Sexual Activity    Alcohol use: Not Currently    Drug use: Not Currently    Sexual activity: Yes     Partners: Male   Other Topics Concern    Not on file   Social History Narrative    Not on file     Social Determinants of Health     Financial Resource Strain: Not on file   Food Insecurity: Not on file   Transportation Needs: Not on file   Physical Activity: Not on file   Stress: Not on file   Social Connections: Not on file   Intimate Partner Violence: Not on file   Housing Stability: Not on file     Family History   Problem Relation Age of Onset    Breast Cancer Mother 6         deceased at 5 yo    [place] Father     Diabetes type 2  Father     Heart Failure Father     Skin Cancer Father         [place]    Breast Cancer Sister 29        deceased at 64 yo    Breast Cancer Sister 12        ER+    Cancer Maternal Grandmother         unknown    Bone Cancer Maternal Grandfather     Cerebral Aneurysm Paternal Grandmother     Cancer Paternal Grandfather     Breast Cancer Maternal Aunt 63    Brain Cancer Paternal Uncle     Breast Cancer Maternal Aunt        Review of Systems    General/Constitutional:           Fever denies.  Chills denies.  Headache denies.  Fatigue denies.         Breast:           Breast lump denies.  Breast pain denies.  Nipple discharge denies.  Red skin denies.       Skin:           Rash denies.  Skin cancer denies.  Skin lesion(s) denies.           PHYSICAL EXAMINATION:   BP 120/80   Pulse 70   Resp 16   Ht 1.651 m (_0 )   Wt 89.8 kg (198 lb)   SpO2 99%   BMI 32.95 kg/m   General Examination:         GENERAL APPEARANCE: well developed, well nourished, Patient is alert and oriented X 3. No acute distress. Appropriate affect, looks stated age.          HEAD: normocephalic, atraumatic.          NECK: neck supple, full range of motion.          LYMPH NODES:  no axillary, supraclavicular or cervical adenopathy.            BREASTS:  The patient was examined in the supine and upright positions.                    RIGHT Breast: the  skin, nipple and areola are normal. No nipple discharge was able to be expressed today. There are no retractions or dimpling noted. There is generalized nodularity without a dominant mass. The axillary tail is normal.                     LEFT Breast: the skin, nipple and areola are normal. No nipple discharge was able to be expressed today. There are no retractions or dimpling noted. There is generalized nodularity without a dominant mass. The axillary tail is normal.          SKIN: good turgor, no rashes noted, warm and dry.           MUSCULOSKELETAL: full range of motion, no swelling or deformity.          EXTREMITIES: no edema, clubbing or cyanosis.      Radiology:  Mammogram Result (most recent):  MAM TOMO DIGITAL SCREEN BILATERAL 12/03/2021    Narrative  SCREENING MAMMOGRAM: 12/03/21    INDICATION: Z12.31,Encounter for screening mammogram for malignant neoplasm of  breast,ICD-10-CM.    COMPARISON: None available time of interpretation.    TECHNIQUE: BILATERAL 3D tomosynthesis and 2D digital mammography.    BREAST DENSITY: Scattered areas of fibroglandular density    FINDINGS:    Right: No suspicious masses, calcifications, or architectural distortion.    Left: No suspicious masses, calcifications, or architectural distortion.    Impression  No suspicious mammographic finding.    Of note, prior mammograms are unavailable for comparison purposes at the time of  this dictation. If obtained, an addendum may be issued.    OVERALL BIRADS: 1: Negative    RECOMMENDATION(S):  Bilateral Screening Mammogram in 1 year              A result letter will be sent to the patient.       MRI Result (most recent):  MRI BREAST BILATERAL W WO CONTRAST 06/10/2022    Narrative  BREAST MRI: 06/10/22    INDICATION: Family history of breast cancer    COMPARISON: Breast MRI 02/02/2019    MRI TECHNIQUE: Multiplanar multisequence imaging of the breasts using a  dedicated breast coil without and with contrast was performed. Computer aided  detection (CAD) software was utilized in interpretation of these images, with  postprocessing, including motion correction, dynamic color maps, 3-D  reformatting, and the MIPS.    CONTRAST: 20 mL ProHance    FINDINGS:    Fibroglandular Tissue: Scattered fibroglandular tissue    Background Parenchymal Enhancement: Minimal    There is no suspicious mass or enhancement in either breast.    There are nonenhancing T2 hyperintensities within the liver, most suggestive of  benign cysts.    Lymph Nodes: No axillary or internal mammary  lymphadenopathy.    Impression  There is no specific MRI evidence of malignancy in either breast.    Patient is due for routine annual screening mammography in June 2024.    OVERALL BIRADS: 1: Negative    RECOMMENDATION(S):  BILATERAL Breast MRI in 1 Year  BILATERAL Screening Mammogram in 6 months          The American Cancer Society recommends supplemental annual screening breast MRI  for all high-risk women with lifetime risk of 20% or greater.         GENETICS:      RISK ASSESSMENT:  TCV8          Assessment:    ICD-10-CM    1. Family  history of breast cancer  Z80.3       2. At high risk for breast cancer  Z91.89       3. Other screening mammogram  Z12.31 MAM TOMO DIGITAL SCREEN BILATERAL             IMPRESSION/RECOMMENDATION:    Marchele Decock is a 61 y.o. woman who presents today for follow up regarding her elevated risk for future breast cancer development. Her exam today and recent MRI show no evidence of breast cancer.    The [employer] Risk app was utilized to calculate her breast cancer risk. Her five- year [place] risk is 5.3% and her lifetime risk TCV 6 20.3%, TCV7 30.9%, TCV8 18.7%, BRCAPRO 14.1%, Claus 14.2%, Gail 22.0%.     At this time, she does meet criteria for chemoprevention using medications for risk reduction. She declined referral to medical oncology for further discussion previously.    With her LTR > 20% she does meet qualifications for high risk imaging surveillance to include annual mammogram, annual breast MRI and two CBEs annually. We discussed the false positives of breast MRI that require further work up with targeted breast US with possible biopsy.     Summary:  To summarize, I recommend two breast exams per year, annual mammogram and annual MRI. She was instructed to call if she notes changes in her exam.     MRI - plan for bilateral breast MRI 05/2023 with CBE a few days later  Mammogram - plan for 11/2022 with same day CBE  Patient will consider chemoprevention, call if she wants referral to  medical oncology  Continue breast surveillance    I spent a total of 20 minutes reviewing the records, imaging studies, reports and counseling the patient regarding the recommended treatment plan for her breast care and documenting in patient record.     Wyline Beady, PA-C

## 2022-06-12 ENCOUNTER — Encounter: Attending: Physician Assistant | Primary: Internal Medicine

## 2022-09-03 ENCOUNTER — Encounter

## 2022-09-04 MED ORDER — IBANDRONATE SODIUM 150 MG PO TABS
150 MG | ORAL_TABLET | ORAL | 3 refills | Status: AC
Start: 2022-09-04 — End: 2022-10-30

## 2022-09-10 ENCOUNTER — Encounter

## 2022-09-10 MED ORDER — POLYMYXIN B-TRIMETHOPRIM 10000-0.1 UNIT/ML-% OP SOLN
OPHTHALMIC | 0 refills | Status: AC
Start: 2022-09-10 — End: 2022-09-20

## 2022-09-10 NOTE — Telephone Encounter (Signed)
This has been addressed through [employer] message patient sent.

## 2022-09-10 NOTE — Telephone Encounter (Signed)
Please call and advise      --symptoms started on Monday  --patient states she thinks she has conjunctivitis in the right eye  --the eye is swollen and red  --it is painful and itching  --there has been some crusty in the eye     [facility]   [phone]    Sent to [facility]

## 2022-09-10 NOTE — Telephone Encounter (Signed)
Please call her back -- [phone]    --patient called to be sure that you received the picture on my chart    Sent to [facility]

## 2022-09-10 NOTE — Telephone Encounter (Signed)
Called and spoke with patient to have her send [place] a picture through mychart.

## 2022-09-10 NOTE — Telephone Encounter (Signed)
Called and notified patient. She will read the mychart message as well.

## 2022-10-14 ENCOUNTER — Ambulatory Visit
Admit: 2022-10-14 | Discharge: 2022-10-14 | Payer: PRIVATE HEALTH INSURANCE | Attending: Family | Primary: Internal Medicine

## 2022-10-14 LAB — AMB POC URINALYSIS DIP STICK MANUAL W/O MICRO: Ketones, Urine, POC: NEGATIVE

## 2022-10-14 MED ORDER — FLUCONAZOLE 150 MG PO TABS
150 | ORAL_TABLET | Freq: Once | ORAL | 0 refills | Status: AC
Start: 2022-10-14 — End: 2022-10-14

## 2022-10-14 NOTE — Telephone Encounter (Signed)
Patient scheduled for today at 12

## 2022-10-14 NOTE — Progress Notes (Signed)
CHIEF COMPLAINT:  Chief Complaint   Patient presents with    Urinary Tract Infection        HISTORY OF PRESENT ILLNESS:  Julie Garner is a 62 y.o. female  who presents with vaginal burning and reports feeling like she is not emptying urine completely.  She states her urine was cloudy this morning.  She reports a little vaginal itching and denies discharge.  She denies fishy odor.  She states her husband has a fungal rash on his bottom.  She tries to avoid contact and using the same facilities.  She denies fever, flank pain, urgency, nausea, vomiting, and diarrhea.  She denies abdominal pain or bladder pressure.        PHQ:      10/23/2021     4:06 PM   PHQ-9    Little interest or pleasure in doing things 0   Feeling down, depressed, or hopeless 0   PHQ-2 Score 0   PHQ-9 Total Score 0       CURRENT MEDICATION LIST:    Prior to Admission medications    Medication Sig Start Date End Date Taking? Authorizing Provider   fluconazole (DIFLUCAN) 150 MG tablet Take 1 tablet by mouth once for 1 dose 10/14/22 10/14/22 Yes Zannie Cove, APRN - NP   ibandronate (BONIVA) 150 MG tablet Take 1 tablet ONCE a MONTH in THE morning with a full GLASS of water. Take ON an EMPTY stomach. DO not eat OR lie down FOR 60 minutes following DOSE. 09/04/22  Yes Jones Broom, MD   Cholecalciferol (VITAMIN D3) 50 MCG (2000 UT) CAPS Take 2 capsules by mouth daily 12/09/21 12/04/22 Yes Jones Broom, MD   calcium-vitamin D (OSCAL-500) 500-5 MG-MCG TABS per tablet Take 2 tablets by mouth daily 12/09/21 12/04/22 Yes Jones Broom, MD   magnesium oxide (MAG-OX) 400 MG tablet Take 1 tablet by mouth daily 12/09/21 12/04/22 Yes Jones Broom, MD   metroNIDAZOLE (METROCREAM) 0.75 % cream Apply TO affected AREA 1-2 times a DAY 10/28/21  Yes [provider]   doxycycline hyclate (VIBRA-TABS) 100 MG tablet Take 0.5 tablets by mouth daily 10/28/21  Yes [provider]           I have reviewed and reconciled the above medication list during today's office  visit.     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      Past Surgical History:   Procedure Laterality Date    MOHS SURGERY  2018      Family History   Problem Relation Age of Onset    Breast Cancer Mother 55        deceased at 6 yo    Macular Degen Father     Diabetes type 2  Father     Heart Failure Father     Skin Cancer Father         [facility]    Breast Cancer Sister 4        deceased at 51 yo    Breast Cancer Sister 56        ER+    Cancer Maternal Grandmother         unknown  Bone Cancer Maternal Grandfather     Cerebral Aneurysm Paternal Grandmother     Cancer Paternal Grandfather     Breast Cancer Maternal Aunt 64    Brain Cancer Paternal Uncle     Breast Cancer Maternal Aunt       Social History     Socioeconomic History    Marital status: Married     Spouse name: Not on file    Number of children: Not on file    Years of education: Not on file    Highest education level: Not on file   Occupational History    Not on file   Tobacco Use    Smoking status: Never    Smokeless tobacco: Never   Vaping Use    Vaping Use: Never used   Substance and Sexual Activity    Alcohol use: Not Currently    Drug use: Not Currently    Sexual activity: Yes     Partners: Male   Other Topics Concern    Not on file   Social History Narrative    Not on file     Social Determinants of Health     Financial Resource Strain: Not on file   Food Insecurity: Not on file   Transportation Needs: Not on file   Physical Activity: Not on file   Stress: Not on file   Social Connections: Not on file   Intimate Partner Violence: Not on file   Housing Stability: Not on file        REVIEW OF SYSTEMS:  Review of Systems   Constitutional: Negative.  Negative for fever.   HENT: Negative.     Eyes: Negative.    Respiratory: Negative.      Cardiovascular: Negative.    Gastrointestinal: Negative.  Negative for abdominal pain, diarrhea, nausea and vomiting.   Endocrine: Negative.    Genitourinary:  Positive for difficulty urinating (burning) and vaginal pain (burning). Negative for dyspareunia, flank pain, frequency, hematuria, urgency, vaginal bleeding and vaginal discharge.   Musculoskeletal: Negative.    Skin: Negative.    Allergic/Immunologic: Negative.    Neurological: Negative.    Hematological: Negative.    Psychiatric/Behavioral: Negative.          PHYSICAL EXAM:    Vital Signs -   Vitals:    10/14/22 1207   BP: 125/73   Pulse: 64   SpO2: 97%   Weight: 86.4 kg (190 lb 6.4 oz)   Height: 1.651 m ( )        Physical Exam  Vitals reviewed.   Constitutional:       Appearance: Normal appearance.   HENT:      Head: Normocephalic and atraumatic.      Nose: Nose normal.      Mouth/Throat:      Mouth: Mucous membranes are moist.   Eyes:      Conjunctiva/sclera: Conjunctivae normal.   Cardiovascular:      Rate and Rhythm: Normal rate and regular rhythm.      Pulses: Normal pulses.      Heart sounds: Normal heart sounds.   Pulmonary:      Effort: Pulmonary effort is normal.      Breath sounds: Normal breath sounds.   Abdominal:      Palpations: Abdomen is soft.   Musculoskeletal:         General: Normal range of motion.      Cervical back: Neck supple.   Skin:  General: Skin is warm.   Neurological:      Mental Status: She is alert and oriented to person, place, and time.   Psychiatric:         Mood and Affect: Mood normal.         Behavior: Behavior normal.        LABS  Results for orders placed or performed in visit on 10/14/22   POC Urinalysis Nonauto w/o Scope (98119)   Result Value Ref Range    Color (UA POC) Yellow     Clarity (UA POC) Clear     Glucose, Urine, POC Negative     Bilirubin, Urine, POC Negative     Ketones, Urine, POC Negative     Specific Gravity, Urine, POC 1.010 1.001 - 1.035    Blood (UA POC) None     pH, Urine, POC 6.5 4.6  - 8.0    Protein, Urine, POC Negative     Urobilinogen, POC Normal     Nitrite, Urine, POC Negative     Leukocyte Esterase, Urine, POC Negative      Orders Only on 12/06/2021   Component Date Value Ref Range Status    Vit D, 25-Hydroxy 12/06/2021 27.4 (L)  30.0 - 90.0 ng/mL Final       I have reviewed the above labs with the patient during today's office visit.    IMPRESSION/PLAN    1. UTI symptoms  Comments:  Urinalysis done and negative for UTI.  Orders:  -     POC Urinalysis Nonauto w/o Scope (81002)  2. Vaginal yeast infection  -     fluconazole (DIFLUCAN) 150 MG tablet; Take 1 tablet by mouth once for 1 dose, Disp-1 tablet, R-0Normal   Most likely vaginal yeast infection based on symptoms.  She is to take Diflucan 1 tab once.  She is to purchase and use OTC Monistat or Clotrimazole.  To eat Yogurt and take probiotics.  To call back if symptoms not improved in 48 hours.       Follow up and Dispositions:  Return if symptoms worsen or fail to improve.       Florene Route, FNP-C

## 2022-10-14 NOTE — Telephone Encounter (Signed)
-  pt states that she may have a UTI   -she has cloudy urine  -pain while urinating   -burning while urinating   -she had symptoms since last Friday   -please call and advise    On board for Dr. Rolla Plate and sent to [facility].Yehuda Mao

## 2022-10-17 ENCOUNTER — Telehealth

## 2022-10-17 MED ORDER — FLUCONAZOLE 150 MG PO TABS
150 | ORAL_TABLET | Freq: Every day | ORAL | 0 refills | Status: AC
Start: 2022-10-17 — End: 2022-10-20

## 2022-10-17 NOTE — Telephone Encounter (Signed)
The patient states that she was given a prescription for Diflucan on 10/14/22 to help with what the nurse practioner thought was a yeast infection    The patient states that she still have symptoms of a yeast infection or bacterial infection and is requesting a call back to discuss further treatment options        Sent to [place] [facility]

## 2022-10-17 NOTE — Telephone Encounter (Signed)
Pt advised.

## 2022-10-17 NOTE — Telephone Encounter (Signed)
lvm

## 2022-10-30 ENCOUNTER — Ambulatory Visit
Admit: 2022-10-30 | Discharge: 2022-10-30 | Payer: PRIVATE HEALTH INSURANCE | Attending: Internal Medicine | Primary: Internal Medicine

## 2022-10-30 DIAGNOSIS — Z0000 Encounter for general adult medical examination without abnormal findings: Secondary | ICD-10-CM

## 2022-10-30 MED ORDER — IBANDRONATE SODIUM 150 MG PO TABS
150 | ORAL_TABLET | ORAL | 3 refills | Status: AC
Start: 2022-10-30 — End: 2023-10-25

## 2022-10-30 MED ORDER — SHINGRIX 50 MCG/0.5ML IM SUSR
50 | Freq: Once | INTRAMUSCULAR | 0 refills | Status: AC
Start: 2022-10-30 — End: 2022-10-30

## 2022-10-30 NOTE — Progress Notes (Signed)
10/30/2022    Julie Garner (DOB:  [birth year]) is a62 y.o. female, here for a preventive medicine evaluation.  Patient presents for annual physical.  She has no new concerns at this time.  She did unfortunately get COVID in January 2024 but recovered well from that without any significant complications.  She is tolerating her Boniva for her osteoporosis without any difficulty.  She remains on her Flagyl cream for her rosacea.  She is currently off her doxycycline.  Plan is that her nose gets stuffy -tends to get stuffy on the dependent side/side she is laying on.  Discussed Flonase.  Hx covid in jan 2024      ASSESSMENT/PLAN:  1. Encounter for well adult exam without abnormal findings  Overview:  Advance Care Planning   HCPOA:Caroline Gornick - DTR;   Living Will: yes   Code Status: Full Code     Shingles: Needs second shot  Last covid 2022  Colonoscopy: Normal September 2020 1 repeat in 10 years  Mammogram yearly last completed June 2023  Flu shot yearly  Orders:  -     EKG 12 Lead; Future  2. Localized osteoporosis with current pathological fracture with routine healing, subsequent encounter  Overview:  History of vertebral fracture.  DEXA scan shows osteopenia.  Repeat DEXA in June 2025  Orders:  -     ibandronate (BONIVA) 150 MG tablet; Take 1 tablet by mouth every 30 days, Disp-3 tablet, R-3This prescription was filled on 09/03/2022. Any refills authorized will be placed on file.Normal  3. Need for prophylactic vaccination and inoculation against varicella  -     zoster recombinant adjuvanted vaccine [facility]) 50 MCG/0.5ML SUSR injection; Inject 0.5 mLs into the muscle once for 1 dose, Disp-1 each, R-0Print  4. Vitamin D deficiency  Overview:  on vitamin D 4000 units daily.  Follow-up in 6 months  5. Breast cancer screening by mammogram     Return in about 1 year (around 10/30/2023) for CPE.    CardioVasc Risk  The ASCVD Risk score (Arnett DK, et al., 2019) failed to calculate for the following reasons:    Unable  to determine if patient is Non-Hispanic African American    Advance care planning  Advance Care Planning   HCPOA:Caroline Bia - DTR;   Living Will: yes   Code Status: Full Code     Shingles: Needs second shot  Last covid 2022  Colonoscopy: Normal September 2020 1 repeat in 10 years  Mammogram yearly last completed June 2023  Flu shot yearly    Review of Systems   Constitutional: Negative.  Negative for appetite change, chills, fever and unexpected weight change.   HENT:  Negative for congestion, ear pain, hearing loss, sinus pain and sore throat.    Eyes: Negative.  Negative for pain, discharge and visual disturbance.   Respiratory:  Negative for cough, shortness of breath and wheezing.    Cardiovascular:  Negative for chest pain and palpitations.   Gastrointestinal:  Negative for abdominal pain, diarrhea, nausea and vomiting.   Endocrine: Negative for cold intolerance, heat intolerance and polydipsia.   Genitourinary:  Negative for difficulty urinating, dysuria, frequency and hematuria.   Musculoskeletal:  Negative for arthralgias.   Skin:  Negative for rash.   Neurological:  Negative for dizziness, weakness and headaches.   Hematological:  Negative for adenopathy.       Prior to Visit Medications    Medication Sig Taking? 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    Acute conjunctivitis of left eye 02/16/2021    History of  shingles 1990    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       Past Surgical History:   Procedure Laterality Date    MOHS SURGERY  2018       Social History     Socioeconomic History    Marital status: Married     Spouse name: Not on file    Number of children: Not on file    Years of education: Not on file    Highest education level: Not on file   Occupational History    Not on file   Tobacco Use    Smoking status: Never    Smokeless tobacco: Never   Vaping Use    Vaping Use: Never used   Substance and Sexual Activity    Alcohol use: Not Currently    Drug use: Not Currently    Sexual activity: Yes     Partners: Male   Other Topics Concern    Not on file   Social History Narrative    Not on file     Social Determinants of Health     Financial Resource Strain: Not on file   Food Insecurity: Not on file   Transportation Needs: Not on file   Physical Activity: Not on file   Stress: Not on file   Social Connections: Not on file   Intimate Partner Violence: Not on file   Housing Stability: Not on file        Family History   Problem Relation Age of Onset    Breast Cancer Mother 20        deceased at 43 yo    Macular Degen Father     Diabetes type 2  Father     Heart Failure Father     Skin Cancer Father         [facility]    Breast Cancer Sister 85        deceased at 89 yo    Breast Cancer Sister 85        ER+    Cancer Maternal Grandmother         unknown    Bone Cancer Maternal Grandfather     Cerebral Aneurysm Paternal Grandmother     Cancer Paternal Grandfather     Breast Cancer Maternal Aunt 59    Brain Cancer Paternal Uncle     Breast Cancer Maternal Aunt        ADVANCE DIRECTIVE: N, <no information>    Vitals:    10/30/22 1137   BP: 120/78   Site: Left Upper Arm   Position: Sitting   Cuff Size: Large Adult   Pulse: 60   SpO2: 95%  Weight: 87.1 kg (192 lb)   Height: 1.651 m (5\' 5" )     Estimated body mass index is 31.95 kg/m as calculated from the following:    Height as of this encounter: 1.651 m  (5\' 5" ).    Weight as of this encounter: 87.1 kg (192 lb).    Physical Exam  Constitutional:       Appearance: Normal appearance. She is normal weight.   HENT:      Head: Normocephalic and atraumatic.      Right Ear: Tympanic membrane normal.      Left Ear: Tympanic membrane normal.      Nose: Nose normal.      Mouth/Throat:      Mouth: Mucous membranes are moist.      Pharynx: Oropharynx is clear.   Eyes:      Extraocular Movements: Extraocular movements intact.      Conjunctiva/sclera: Conjunctivae normal.      Pupils: Pupils are equal, round, and reactive to light.   Cardiovascular:      Rate and Rhythm: Normal rate and regular rhythm.      Pulses: Normal pulses.      Heart sounds: Normal heart sounds.   Pulmonary:      Effort: Pulmonary effort is normal.      Breath sounds: Normal breath sounds.   Abdominal:      General: Abdomen is flat. Bowel sounds are normal.      Palpations: Abdomen is soft.   Musculoskeletal:         General: No swelling. Normal range of motion.      Cervical back: Normal range of motion and neck supple.   Skin:     General: Skin is warm and dry.   Neurological:      General: No focal deficit present.      Mental Status: She is alert and oriented to person, place, and time.      Cranial Nerves: No cranial nerve deficit.      Sensory: No sensory deficit.      Motor: No weakness.   Psychiatric:         Mood and Affect: Mood normal.         Behavior: Behavior normal.         Thought Content: Thought content normal.         Judgment: Judgment normal.             Latest Ref Rng & Units 10/25/2021     8:45 AM   LAB [facility]   A1C 4.0 - 6.0 % 5.6    A1C POC 4.0 - 6.0 % 5.6    GLU random 70 - 99 mg/dL 161    CHOL 096 - 045 mg/dL 409    TRIG 0 - 811 mg/dL 44    HDL >=91 mg/dL 60    LDL CALC 0.0 - 478.2 mg/dL 956.2    NA 130 - 865 mmol/L 141    K 3.5 - 5.3 mmol/L 4.2    BUN 8 - 23 mg/dL 11    CR 0.5 - 1.0 mg/dL 0.6    GFR >=78 IO/NGE/9.52W 101    CA 8.8 - 10.2 mg/dL 9.0    ALT 0 - 35 unit/L 17     AST 0 - 35 unit/L 18    TSH 0.358 - 3.740 mcIU/mL 1.350    HGB 11.5 - 15.7 g/dL 41.3        Lab Results  Component Value Date/Time    CHOL 193 10/25/2021 08:45 AM    TRIG 44 10/25/2021 08:45 AM    HDL 60 10/25/2021 08:45 AM    GLUCOSE 100 10/25/2021 08:45 AM    LABA1C 5.6 10/25/2021 08:45 AM       Immunization History   Administered Date(s) Administered    COVID-19, PFIZER GRAY top, DO NOT Dilute, (age 20 y+), IM, 30 mcg/0.3 mL 09/17/2019, 10/08/2019, 06/05/2020, 10/07/2020    Influenza, AFLURIA (age 46 yrs+), FLUZONE, (age 19 mo+), MDV, 0.36mL 04/09/2022    Zoster Recombinant (Shingrix) 06/05/2020       Health Maintenance   Topic Date Due    Respiratory Syncytial Virus (RSV) Pregnant or age 21 yrs+ (1 - 1-dose 60+ series) Never done    Shingles vaccine (2 of 2) 07/31/2020    COVID-19 Vaccine (5 - 2023-24 season) 02/28/2022    Cervical cancer screen  10/24/2022    DTaP/Tdap/Td vaccine (1 - Tdap) 10/23/2065 (Originally 08/26/1979)    Breast cancer screen  12/04/2022    Depression Screen  10/30/2023    Lipids  10/26/2026    Colorectal Cancer Screen  03/21/2030    Flu vaccine  Completed    Hepatitis A vaccine  Aged Out    Hepatitis B vaccine  Aged Out    Hib vaccine  Aged Out    Polio vaccine  Aged Out    Meningococcal (ACWY) vaccine  Aged Out    Pneumococcal 0-64 years Vaccine  Aged Out    Diabetes screen  Discontinued    Hepatitis C screen  Discontinued    HIV screen  Discontinued         An electronic signature was used to authenticate this note.    --Jones Broom, MD on 10/31/2022 at 3:13 PM

## 2022-10-30 NOTE — Patient Instructions (Addendum)
Trial of flonase for nasal congestion         Starting a Weight Loss Plan: Care Instructions  Overview     It can be a challenge to lose weight. But your doctor can help you make a weight-loss plan that meets your needs.  You don't have to make a lot of big changes at once. A better idea might be to focus on small changes and stick with them. When those changes become habit, you can add a few more changes.  Some people find it helpful to take an exercise or nutrition class. If you have questions, ask your doctor about seeing a registered dietitian or an exercise specialist. You might also think about joining a weight-loss support group.  If you're not ready to make changes right now, try to pick a date in the future. Then make an appointment with your doctor to talk about when and how you'll get started with a plan.  Follow-up care is a key part of your treatment and safety. Be sure to make and go to all appointments, and call your doctor if you are having problems. It's also a good idea to know your test results and keep a list of the medicines you take.  How can you care for yourself at home?  Set realistic goals. Many people expect to lose much more weight than is likely. A weight loss of 5% to 10% of your body weight may be enough to improve your health.  Get family and friends involved to provide support. Talk to them about why you are trying to lose weight, and ask them to help. They can help by participating in exercise and having meals with you, even if they may be eating something different.  Find what works best for you. If you do not have time or do not like to cook, a program that offers meal replacement bars or shakes may be better for you. Or if you like to prepare meals, finding a plan that includes daily menus and recipes may be best.  Ask your doctor about other health professionals who can help you achieve your weight loss goals.  A dietitian can help you make healthy changes in your diet.  An  exercise specialist or personal trainer can help you develop a safe and effective exercise program.  A counselor or psychiatrist can help you cope with issues such as depression, anxiety, or family problems that can make it hard to focus on weight loss.  Consider joining a support group for people who are trying to lose weight. Your doctor can suggest groups in your area.  Where can you learn more?  Go to [employer] and enter U357 to learn more about "Starting a Weight Loss Plan: Care Instructions."  Current as of: September 20, 2023Content Version: 14.0   2006-2024 [employer].   Care instructions adapted under license by [facility]. If you have questions about a medical condition or this instruction, always ask your healthcare professional. [employer] disclaims any warranty or liability for your use of this information.           Learning About Healthy Weight  What is a healthy weight?     A healthy weight is the weight at which you feel good about yourself and have energy for work and play. It's also one that lowers your risk for health problems.  What can you do to stay at a healthy weight?  It can be hard to stay at a  healthy weight, especially when fast food, vending-machine snacks, and processed foods are so easy to find. And activity may be low on your list of things to do. But staying at a healthy weight may be easier than you think.  Here are some dos and don'ts for staying at a healthy weight.  Do eat healthy foods  The kinds of foods you eat have a big impact on both your weight and your health. Reaching and staying at a healthy weight is not about going on a diet. It's about making healthier food choices every day and changing your diet for good.  Healthy eating means eating a variety of foods so that you get all the nutrients you need. Your body needs protein, carbohydrate, and fats for energy. They keep your heart beating, your  brain active, and your muscles working.  On most days, try to eat from each food group. This means eating a variety of:  Whole grains, such as whole wheat breads and pastas.  Fruits and vegetables.  Dairy products, such as low-fat milk, yogurt, and cheese.  Lean proteins, such as all types of fish, chicken without the skin, and beans.  Don't have too much or too little of one thing. All foods, if eaten in moderation, can be part of healthy eating. Even sweets can be okay.  If your favorite foods are high in fat, salt, sugar, or calories, limit how often you eat them. Eat smaller servings, or look for healthy substitutes.  Do watch what you eat  Many people eat more than their bodies need. Part of staying at a healthy weight means learning how much food you really need from day to day and not eating more than that. Even with healthy foods, eating too much can make you gain weight.  Having a well-balanced diet means that you eat enough, but not too much, and that your food gives you the nutrients you need to stay healthy. So listen to your body. Eat when you're hungry. Stop when you feel satisfied.  It's a good idea to have healthy snacks ready for when you get hungry. Keep healthy snacks with you at work, in your car, and at home. If you have a healthy snack easily available, you'll be less likely to pick a candy bar or bag of chips from a vending machine instead.  Some healthy snacks you might want to keep on hand are fruit, low-fat yogurt, string cheese, low-fat microwave popcorn, raisins and other dried fruit, nuts, whole wheat crackers, pretzels, carrots, celery sticks, and broccoli.  Do some physical activity  A big part of reaching and staying at a healthy weight is being active.  When you're active, you burn calories. This makes it easier to reach and stay at a healthy weight. When you're active on a regular basis, your body burns more calories, even when you're at rest. Being active helps you lose fat and  build lean muscle.  Try to be active for at least 1 hour every day. This may sound like a lot, but it's okay to be active in smaller blocks of time that add up to 1 hour a day. Any activity that makes your heart beat faster and keeps it there for a while counts. A brisk walk, run, or swim will get your heart beating faster. So will climbing stairs, shooting baskets, or cycling. Even some household chores like vacuuming and mowing the lawn will get your heart rate up.  Pick activities that you enjoy--ones that  make your heart beat faster, your muscles stronger, and your muscles and joints more flexible. If you find more than one thing you like doing, do them all. You don't have to do the same thing every day.  Don't diet  Diets don't work.  Diets are temporary. Because you give up so much when you diet, you may be hungry and think about food all the time. And after you stop dieting, you also may overeat to make up for what you missed. Most people who diet end up gaining back the pounds they lost--and more.  Remember that healthy bodies come in lots of shapes and sizes. Everyone can get healthier by eating better and being more active.  Where can you learn more?  Go to [employer] and enter B909 to learn more about "Learning About Healthy Weight."  Current as of: September 20, 2023Content Version: 14.0   2006-2024 [employer].   Care instructions adapted under license by [facility]. If you have questions about a medical condition or this instruction, always ask your healthcare professional. [employer] disclaims any warranty or liability for your use of this information.           Learning About Low-Carbohydrate Diets  What is a low-carbohydrate diet?     A low-carbohydrate (or "low-carb") diet limits foods and drinks that have carbohydrates. This includes grains, fruits, milk and yogurt, and starchy vegetables like potatoes, beans, and corn. It also  avoids foods and drinks that have added sugar. Instead, low-carb diets include foods that are high in protein and fat.  Why might you follow a low-carb diet?  Low-carb diets may be used for a variety of reasons, such as for weight loss. People who have diabetes may use a low-carb diet to help manage their blood sugar levels.  What should you do before you start the diet?  Talk to your doctor before you try any diet. This is even more important if you have health problems like kidney disease, heart disease, or diabetes. Your doctor may suggest that you meet with a registered dietitian. A dietitian can help you make an eating plan that works for you.  What foods do you eat on a low-carb diet?  On a low-carb diet, you choose foods that are high in protein and fat. Examples of these are:  Meat, poultry, and fish.  Eggs.  Nuts, such as walnuts, pecans, almonds, and peanuts.  Peanut butter and other nut butters.  Tofu.  Avocado.  Olives.  Non-starchy vegetables like broccoli, cauliflower, green beans, mushrooms, peppers, lettuce, and spinach.  Unsweetened non-dairy milks like almond milk and coconut milk.  Cheese, cottage cheese, and cream cheese.  Where can you learn more?  Go to [employer] and enter C335 to learn more about "Learning About Low-Carbohydrate Diets."  Current as of: September 20, 2023Content Version: 14.0   2006-2024 [employer].   Care instructions adapted under license by [facility]. If you have questions about a medical condition or this instruction, always ask your healthcare professional. [employer] disclaims any warranty or liability for your use of this information.           Well Visit, Ages 29 to 39: Care Instructions  Well visits can help you stay healthy. Your doctor has checked your overall health and may have suggested ways to take good care of yourself. Your doctor also may have recommended tests. You can help prevent  illness with healthy eating, good sleep, vaccinations, regular exercise, and other steps.  Get the tests that you and your doctor decide on. Depending on your age and risks, examples might include screening for diabetes; hepatitis C; HIV; and cervical, breast, lung, and colon cancer. Screening helps find diseases before any symptoms appear.   Eat healthy foods. Choose fruits, vegetables, whole grains, lean protein, and low-fat dairy foods. Limit saturated fat and reduce salt.     Limit alcohol. Men should have no more than 2 drinks a day. Women should have no more than 1. For some people, no alcohol is the best choice.   Exercise. Get at least 30 minutes of exercise on most days of the week. Walking can be a good choice.     Reach and stay at your healthy weight. This will lower your risk for many health problems.   Take care of your mental health. Try to stay connected with friends, family, and community, and find ways to manage stress.     If you're feeling depressed or hopeless, talk to someone. A counselor can help. If you don't have a counselor, talk to your doctor.   Talk to your doctor if you think you may have a problem with alcohol or drug use. This includes prescription medicines, marijuana, and other drugs.     Avoid tobacco and nicotine: Don't smoke, vape, or chew. If you need help quitting, talk to your doctor.   Practice safer sex. Getting tested, using condoms or dental dams, and limiting sex partners can help prevent STIs.     Use birth control if it's important to you to prevent pregnancy. Talk with your doctor about your choices and what might be best for you.   Prevent problems where you can. Protect your skin from too much sun, wash your hands, brush your teeth twice a day, and wear a seat belt in the car.   Where can you learn more?  Go to [employer] and enter P072 to learn more about "Well Visit, Ages 80 to 80: Care Instructions."  Current as of: August 6,  2023Content Version: 14.0   2006-2024 [employer].   Care instructions adapted under license by [facility]. If you have questions about a medical condition or this instruction, always ask your healthcare professional. [employer] disclaims any warranty or liability for your use of this information.           Learning About Living Patrecia Pace  What is a living will?     A living will, also called a declaration, is a legal form. It tells your family and your doctor your wishes when you can't speak for yourself. It's used by the health professionals who will treat you as you near the end of your life or if you get seriously hurt or ill.  If you put your wishes in writing, your loved ones and others will know what kind of care you want. They won't need to guess. This can ease your mind and be helpful to others. And you can change or cancel your living will at any time.  A living will is not the same as an estate or property will. An estate will explains what you want to happen with your money and property after you die.  How do you use it?  Keep these facts in mind about how a living will is used.  Your living will is used only if you can't speak or make decisions for yourself. Most often, one or more doctors must certify that you can't speak or decide for  yourself before your living will takes effect.  If you get better and can speak for yourself again, you can accept or refuse any treatment. It doesn't matter what you said in your living will.  Some states may limit your right to refuse treatment in certain cases. For example, you may need to clearly state in your living will that you don't want artificial hydration and nutrition, such as being fed through a tube.  Is a living will a legal document?  A living will is a legal document. Each state has its own laws about living wills. And a living will may be called something else in your state.  Here are some things to know about  living wills.  You don't need an attorney to complete a living will. But legal advice can be helpful if your state's laws are unclear. It can also help if your health history is complicated or your family can't agree on what should be in your living will.  You can change your living will at any time. Some people find that their wishes about end-of-life care change as their health changes. If you make big changes to your living will, complete a new form.  If you move to another state, make sure that your living will is legal in the state where you now live. In most cases, doctors will respect your wishes even if you have a form from a different state.  You might use a universal form that has been approved by many states. This kind of form can sometimes be filled out and stored online. Your digital copy will then be available wherever you have a connection to the internet. The doctors and nurses who need to treat you can find it right away.  Your state may offer an online registry. This is another place where you can store your living will online.  It's a good idea to get your living will notarized. This means using a person called a notary public to watch two people sign, or witness, your living will.  What should you know when you create a living will?  Here are some questions to ask yourself as you make your living will.  Do you know enough about life support methods that might be used? If not, talk to your doctor so you know what might be done if you can't breathe on your own, your heart stops, or you can't swallow.  What things would you still want to be able to do after you receive life-support methods? Would you want to be able to walk? To speak? To eat on your own? To live without the help of machines?  Do you want certain religious practices performed if you become very ill?  If you have a choice, where do you want to be cared for? In your home? At a hospital or nursing home?  If you have a choice at the end  of your life, where would you prefer to die? At home? In a hospital or nursing home? Somewhere else?  Would you prefer to be buried or cremated?  Do you want your organs to be donated after you die?  What should you do with your living will?  Make sure that your family members and your health care agent have copies of your living will (also called a declaration).  Give your doctor a copy of your living will. Ask to have it kept as part of your medical record. If you have more than one doctor,  make sure that each one has a copy.  Put a copy of your living will where it can be easily found. For example, some people may put a copy on their refrigerator door. If you are using a digital copy, be sure your doctor, family members, and health care agent know how to find and access it.  Where can you learn more?  Go to [employer] and enter K356 to learn more about "Learning About Living Wills."  Current as of: November 16, 2023Content Version: 14.0   2006-2024 [employer].   Care instructions adapted under license by [facility]. If you have questions about a medical condition or this instruction, always ask your healthcare professional. [employer] disclaims any warranty or liability for your use of this information.      High-Fiber Diet     What Is Fiber?   Dietary fiber is a form of carbohydrate found in plants that cannot be digested by humans. All plants contain fiber, including fruits, vegetables, grains, and legumes. Fiber is often classified into two categories: soluble and insoluble.   Soluble fiber draws water into the bowel and can help slow digestion. Examples of foods that are high in soluble fiber include oatmeal, oat bran, barley, legumes (eg, beans and peas), apples, and strawberries.   Insoluble fiber speeds digestion and can add bulk to the stool. Examples of foods that are high in insoluble fiber include whole-wheat products, wheat bran,  cauliflower, green beans, and potatoes.   Why Follow a High-Fiber Diet?   A high-fiber diet is often recommended to prevent and treat constipation , hemorrhoids , diverticulitis , and irritable bowel syndrome . Eating a high-fiber diet can also help improve your cholesterol levels, lower your risk of coronary heart disease , reduce your risk of type 2 diabetes , and lower your weight. For people with type 1 or 2 diabetes, a high-fiber diet can also help stabilize blood sugar levels.   How Much Fiber Should I Eat?   A high-fiber diet should contain  20-35 grams  of fiber a day. This is actually the amount recommended for the general adult population; however, most Americans eat only 15 grams of fiber per day.   Digestion of Fiber   Eating a higher fiber diet than usual can take some getting used to by your body's digestive system. To avoid the side effects of sudden increases in dietary fiber (eg, gas, cramping, bloating, and diarrhea), increase fiber gradually and be sure to drink plenty of fluids every day.   Tips for Increasing Fiber Intake   Whenever possible, choose whole grains over refined grains (eg, brown rice instead of white rice, whole-wheat bread instead of white bread).    Include a variety of grains in your diet, such as wheat, rye, barley, oats, quinoa, and bulgur.    Eat more vegetarian-based meals. Here are some ideas: black bean burgers, eggplant lasagna, and veggie tofu stir-fry.    Choose high-fiber snacks, such as fruits, popcorn, whole-grain crackers, and nuts.    Make whole-grain cereal or whole-grain toast part of your daily breakfast regime.    When eating out, whether ordering a sandwich or dinner, ask for extra vegetables.    When baking, replace part of the white flour with whole-wheat flour. Whole-wheat flour is particularly easy to incorporate into a recipe.    High-Fiber Diet Eating Guide   Food Category   Foods Recommended   Notes   Grains   Whole-grain breads, muffins, bagels, or  pita bread Rye bread Whole-wheat crackers or crisp breads Whole-grain or bran cereals Oatmeal, oat bran, or grits Wheat germ Whole-wheat pasta and brown rice   Read the ingredients list on food labels. Look for products that list "whole" as the first ingredient (eg, whole-wheat, whole oats). Choose cereals with at least 2 grams of fiber per serving.   Vegetables   All vegetables, especially asparagus, bean sprouts, broccoli, Brussels sprouts, cabbage, carrots, cauliflower, celery, corn, greens, green beans, green pepper, onions, peas, potatoes (with skin), snow peas, spinach, squash, sweet potatoes, tomatoes, zucchini   For maximum fiber intake, eat the peels of fruits and vegetablesjust be sure to wash them well first.   Fruits   All fruits, especially apples, berries, grapefruits, mangoes, nectarines, oranges, peaches, pears, dried fruits (figs, dates, prunes, raisins)   Choose raw fruits and vegetables over juice, cooked, or cannedraw fruit has more fiber. Dried fruit is also a good source of fiber.   Milk   With the exception of yogurt containing inulin (a type of fiber), dairy foods provide little fiber.   Add more fiber by topping your yogurt or cottage cheese with fresh fruit, whole grain or bran cereals, nuts, or seeds.   Meats and Beans   All beans and peas, especially Garbanzo beans, kidney beans, lentils, lima beans, split peas, and pinto beans All nuts and seeds, especially almonds, peanuts, [place] nuts, cashews, peanut butter, walnuts, sesame and sunflower seeds All meat, poultry, fish, and eggs   Increase fiber in meat dishes by adding pinto beans, kidney beans, black-eyed peas, bran, or oatmeal. If you are following a low-fat diet, use nuts and seeds only in moderation.   Fats and Oils   All in moderation   Fats and oils do not provide fiber   Snacks, Sweets, and Condiments   Fruit Nuts Popcorn, whole-wheat pretzels, or trail mix made with dried fruits, nuts, and seeds Cakes, breads, and cookies made  with oatmeal or whole-wheat flour   Most snack foods do not provide much fiber. Choose snacks with at least 2 grams of fiber per serving.     Last Reviewed: March 2011 Jackqulyn Livings, MS, MPH, RD   Updated: 09/25/2009

## 2022-10-31 DIAGNOSIS — Z0000 Encounter for general adult medical examination without abnormal findings: Secondary | ICD-10-CM

## 2022-12-22 ENCOUNTER — Ambulatory Visit
Admit: 2022-12-22 | Discharge: 2022-12-22 | Payer: PRIVATE HEALTH INSURANCE | Attending: Physician Assistant | Primary: Internal Medicine

## 2022-12-22 ENCOUNTER — Inpatient Hospital Stay: Admit: 2022-12-22 | Payer: PRIVATE HEALTH INSURANCE | Primary: Internal Medicine

## 2022-12-22 DIAGNOSIS — Z1231 Encounter for screening mammogram for malignant neoplasm of breast: Secondary | ICD-10-CM

## 2022-12-22 DIAGNOSIS — Z803 Family history of malignant neoplasm of breast: Secondary | ICD-10-CM

## 2022-12-22 NOTE — Progress Notes (Signed)
BREAST SURGERY FOLLOW UP VISIT    01/22/2022    CHIEF COMPLAINT:  High risk for future breast cancer.    HISTORY OF PRESENT ILLNESS:  Julie Garner is a 62 y.o. presents for 6 month follow up. Patient was initially referred by Dr. Elisabeth Pigeon who requested that I evaluate her for her elevated risk for future breast cancer development.  The patient's family history is significant in that her mother was diagnosed with breast cancer at 79 yo, sister diagnosed with breast cancer at 27 yo, sister diagnosed with breast cancer at 51 yo, maternal aunt diagnosed with breast cancer at 90 yo, and possible paternal aunts with breast cancer.   She presents today to discuss her elevated risk for breast cancer and the best mechanism for surveillance and/or prevention.  The patient states that she herself has not noticed any abnormal masses in either breast.  She denies any change in the appearance of her breasts or the skin of her breasts.  She denies bilateral nipple discharge.  She has no other systemic complaints and otherwise feels well today.     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     No Known Allergies  Social History     Socioeconomic History    Marital status: Married     Spouse name: Not on file    Number of children: Not on file    Years of education: Not on file    Highest education level: Not on file   Occupational History    Not on file   Tobacco Use    Smoking status: Never    Smokeless tobacco: Never   Vaping Use    Vaping Use: Never used   Substance and Sexual Activity  Alcohol use: Not Currently    Drug use: Not Currently    Sexual activity: Yes     Partners: Male   Other Topics Concern    Not on file   Social History Narrative    Not on file     Social Determinants of Health     Financial Resource Strain: Not on file   Food Insecurity: Not on file   Transportation Needs: Not on file   Physical Activity: Not on file   Stress: Not on file   Social Connections: Not on file   Intimate Partner Violence: Not on file   Housing Stability: Not on file     Family History   Problem Relation Age of Onset    Breast Cancer Mother 75        deceased at 26 yo    Macular Degen Father     Diabetes type 2  Father     Heart Failure Father     Skin Cancer Father         BCC    Breast Cancer Sister 30        deceased at 31 yo    Breast Cancer Sister 58        ER+    Cancer Maternal Grandmother          unknown    Bone Cancer Maternal Grandfather     Cerebral Aneurysm Paternal Grandmother     Cancer Paternal Grandfather     Breast Cancer Maternal Aunt 65    Brain Cancer Paternal Uncle     Breast Cancer Maternal Aunt        Review of Systems    General/Constitutional:           Fever denies.  Chills denies.  Headache denies.  Fatigue denies.         Breast:           Breast lump denies.  Breast pain denies.  Nipple discharge denies.  Red skin denies.       Skin:           Rash denies.  Skin cancer denies.  Skin lesion(s) denies.           PHYSICAL EXAMINATION:   Ht 1.651 m (5\' 5" )   Wt 86.2 kg (190 lb)   BMI 31.62 kg/m   General Examination:         GENERAL APPEARANCE: well developed, well nourished, Patient is alert and oriented X 3. No acute distress. Appropriate affect, looks stated age.          HEAD: normocephalic, atraumatic.          NECK: neck supple, full range of motion.          LYMPH NODES:  no axillary, supraclavicular or cervical adenopathy.            BREASTS:  The patient was examined in the supine and upright positions.                    RIGHT Breast: the skin, nipple and areola are normal. No nipple discharge was able to be expressed today. There are no retractions or dimpling noted. There is generalized nodularity without a dominant mass. The axillary tail is normal.                     LEFT Breast: the skin, nipple and areola are normal. No nipple discharge was able to be expressed today.  There are no retractions or dimpling noted. There is generalized nodularity without a dominant mass. The axillary tail is normal.          SKIN: good turgor, no rashes noted, warm and dry.          MUSCULOSKELETAL: full range of motion, no swelling or deformity.          EXTREMITIES: no edema, clubbing or cyanosis.      Radiology:  Mammogram Result (most recent):  MAM TOMO DIGITAL SCREEN BILATERAL 12/03/2021    Narrative  SCREENING MAMMOGRAM: 12/03/21    INDICATION: Z12.31,Encounter for screening  mammogram for malignant neoplasm of  breast,ICD-10-CM.    COMPARISON: None available time of interpretation.    TECHNIQUE: BILATERAL 3D tomosynthesis and 2D digital mammography.    BREAST DENSITY: Scattered areas of fibroglandular density    FINDINGS:    Right: No suspicious masses, calcifications, or architectural distortion.    Left: No suspicious masses, calcifications, or architectural distortion.    Impression  No suspicious mammographic finding.    Of note, prior mammograms are unavailable for comparison purposes at the time of  this dictation. If obtained, an addendum may be issued.    OVERALL BIRADS: 1: Negative    RECOMMENDATION(S):  Bilateral Screening Mammogram in 1 year              A result letter will be sent to the patient.       MRI Result (most recent):  MRI BREAST BILATERAL W WO CONTRAST 06/10/2022    Narrative  BREAST MRI: 06/10/22    INDICATION: Family history of breast cancer    COMPARISON: Breast MRI 02/02/2019    MRI TECHNIQUE: Multiplanar multisequence imaging of the breasts using a  dedicated breast coil without and with contrast was performed. Computer aided  detection (CAD) software was utilized in interpretation of these images, with  postprocessing, including motion correction, dynamic color maps, 3-D  reformatting, and the MIPS.    CONTRAST: 20 mL ProHance    FINDINGS:    Fibroglandular Tissue: Scattered fibroglandular tissue    Background Parenchymal Enhancement: Minimal    There is no suspicious mass or enhancement in either breast.    There are nonenhancing T2 hyperintensities within the liver, most suggestive of  benign cysts.    Lymph Nodes: No axillary or internal mammary lymphadenopathy.    Impression  There is no specific MRI evidence of malignancy in either breast.    Patient is due for routine annual screening mammography in June 2024.    OVERALL BIRADS: 1: Negative    RECOMMENDATION(S):  BILATERAL Breast MRI in 1 Year  BILATERAL Screening Mammogram in 6 months          The  American Cancer Society recommends supplemental annual screening breast MRI  for all high-risk women with lifetime risk of 20% or greater.         GENETICS:      RISK ASSESSMENT:  TCV8          Assessment:    ICD-10-CM    1. Family history of breast cancer  Z80.3 MRI BREAST BILATERAL W WO CONTRAST      2. At high risk for breast cancer  Z91.89 MRI BREAST BILATERAL W WO CONTRAST               IMPRESSION/RECOMMENDATION:    Julie Garner is a 62 y.o. woman who presents today for follow up regarding her elevated risk for future breast cancer development. Her  exam today and recent MRI show no evidence of breast cancer. Mammogram pending at time of visit.    The [employer] Risk app was utilized to calculate her breast cancer risk. Her five- year Dondra Spry risk is 5.3% and her lifetime risk TCV 6 20.3%, TCV7 30.9%, TCV8 18.7%, BRCAPRO 14.1%, Claus 14.2%, Gail 22.0%.     At this time, she does meet criteria for chemoprevention using medications for risk reduction. She declined referral to medical oncology for further discussion previously.    With her LTR > 20% she does meet qualifications for high risk imaging surveillance to include annual mammogram, annual breast MRI and two CBEs annually. We discussed the false positives of breast MRI that require further work up with targeted breast US with possible biopsy.     Summary:  To summarize, I recommend two breast exams per year, annual mammogram and annual MRI. She was instructed to call if she notes changes in her exam.     MRI - plan for bilateral breast MRI 05/2023 with CBE a few days later  Mammogram - plan for 11/2023 with same day CBE  Patient will consider chemoprevention, call if she wants referral to medical oncology  Will reach out with mammogram results when they return  Continue breast surveillance    I spent a total of 20 minutes reviewing the records, imaging studies, reports and counseling the patient regarding the recommended treatment plan for her breast care and  documenting in patient record.     Joline Salt, PA-C

## 2023-02-20 ENCOUNTER — Encounter

## 2023-02-20 MED ORDER — MAGNESIUM OXIDE 400 MG PO TABS
400 | ORAL_TABLET | Freq: Every day | ORAL | 3 refills | Status: AC
Start: 2023-02-20 — End: 2024-02-15

## 2023-05-13 ENCOUNTER — Encounter

## 2023-05-13 LAB — URINALYSIS W/ RFLX MICROSCOPIC
Bilirubin, Urine: NEGATIVE
Blood, Urine: NEGATIVE
Glucose, Ur: NEGATIVE
Ketones, Urine: NEGATIVE
Leukocyte Esterase, Urine: NEGATIVE
Nitrite, Urine: NEGATIVE
Protein, UA: NEGATIVE
Specific Gravity, UA: 1.01 (ref 1.003–1.035)
Urobilinogen, Urine: 0.2 U/dL (ref >1.0)
pH, Urine: 6.5 (ref 4.5–8.0)

## 2023-05-13 NOTE — Progress Notes (Signed)
 Pt came in with husband today to get flu shot and give urine sample for uti symptoms.

## 2023-05-14 LAB — CULTURE, URINE: FINAL REPORT: 60000

## 2023-05-18 NOTE — Telephone Encounter (Signed)
 Pt says she is no longer experiencing urinary symptoms. She still feels bloated but other than that she feels fine.

## 2023-05-18 NOTE — Other (Signed)
 Urinalysis negative.  No findings to indicate an infection.  Your cultures demonstrated mixed gram-negative flora.  Usually considered a contamination  How are you feeling at this time?

## 2023-06-12 NOTE — Telephone Encounter (Signed)
 Called patient to let her know we removed the "no-show" from her 05/13/2023 visit. Patient states that she is feeling much better as well.

## 2023-06-18 ENCOUNTER — Ambulatory Visit
Admit: 2023-06-18 | Discharge: 2023-06-18 | Payer: PRIVATE HEALTH INSURANCE | Attending: Physician Assistant | Admitting: Physician Assistant | Primary: Internal Medicine

## 2023-06-18 VITALS — BP 142/78 | HR 86 | Resp 16 | Ht 65.0 in | Wt 196.0 lb

## 2023-06-18 DIAGNOSIS — Z803 Family history of malignant neoplasm of breast: Principal | ICD-10-CM

## 2023-06-18 NOTE — Progress Notes (Signed)
 BREAST SURGERY FOLLOW UP VISIT    01/22/2022    CHIEF COMPLAINT:  High risk for future breast cancer.    HISTORY OF PRESENT ILLNESS:  Julie Garner is a 62 y.o. presents for 6 month follow up. Patient was initially referred by Dr. Elisabeth Pigeon who requested that

## 2023-09-03 ENCOUNTER — Ambulatory Visit: Payer: Self-pay | Admitting: Psychiatry

## 2023-09-08 ENCOUNTER — Encounter

## 2023-09-08 MED ORDER — IBANDRONATE SODIUM 150 MG PO TABS
150 | ORAL_TABLET | Freq: Every day | ORAL | 3 refills | Status: AC
Start: 2023-09-08 — End: ?

## 2023-09-14 ENCOUNTER — Encounter

## 2023-09-14 ENCOUNTER — Ambulatory Visit
Admit: 2023-09-14 | Discharge: 2023-09-14 | Payer: PRIVATE HEALTH INSURANCE | Attending: Internal Medicine | Primary: Internal Medicine

## 2023-09-14 VITALS — BP 130/80 | HR 67 | Temp 98.00000°F | Wt 192.0 lb

## 2023-09-14 DIAGNOSIS — K529 Noninfective gastroenteritis and colitis, unspecified: Secondary | ICD-10-CM

## 2023-09-14 LAB — CBC WITH AUTO DIFFERENTIAL
Basophils %: 0.5 % (ref 0.0–2.0)
Basophils Absolute: 0 10*3/uL (ref 0.0–0.2)
Eosinophils %: 2 % (ref 0.0–7.0)
Eosinophils Absolute: 0.1 10*3/uL (ref 0.0–0.5)
Hematocrit: 41.5 % (ref 34.0–47.0)
Hemoglobin: 13.6 g/dL (ref 11.5–15.7)
Immature Grans (Abs): 0.01 10*3/uL (ref 0.00–0.06)
Immature Granulocytes %: 0.2 % (ref 0.0–0.6)
Lymphocytes Absolute: 1.2 10*3/uL (ref 1.0–3.2)
Lymphocytes: 28.4 % (ref 15.0–45.0)
MCH: 31.3 pg (ref 27.0–34.5)
MCHC: 32.8 g/dL (ref 30.0–36.0)
MCV: 95.6 fL (ref 81.0–99.0)
MPV: 11.9 fL (ref 7.0–12.2)
Monocytes %: 14.2 % — ABNORMAL HIGH (ref 4.0–12.0)
Monocytes Absolute: 0.6 10*3/uL (ref 0.3–1.0)
NRBC Absolute: 0 10*3/uL (ref 0.000–0.012)
NRBC Automated: 0 % (ref 0.0–0.2)
Neutrophils %: 54.7 % (ref 42.0–74.0)
Neutrophils Absolute: 2.2 10*3/uL (ref 1.6–7.3)
Platelets: 202 10*3/uL (ref 140–440)
RBC: 4.34 x10e6/mcL (ref 3.60–5.20)
RDW: 13.1 % (ref 10.0–17.0)
WBC: 4.1 10*3/uL (ref 3.8–10.6)

## 2023-09-14 LAB — URINALYSIS W/ RFLX MICROSCOPIC
Bilirubin, Urine: NEGATIVE
Blood, Urine: NEGATIVE
Glucose, Ur: NEGATIVE
Ketones, Urine: 15 mg/dL — AB
Leukocyte Esterase, Urine: NEGATIVE
Nitrite, Urine: NEGATIVE
Protein, UA: NEGATIVE
Specific Gravity, UA: 1.01 (ref 1.003–1.035)
Urobilinogen, Urine: 0.2 EU/dL (ref 0.2–1.0)
pH, Urine: 6 (ref 4.5–8.0)

## 2023-09-14 LAB — COMPREHENSIVE METABOLIC PANEL
ALT: 20 U/L (ref 0–42)
AST: 25 U/L (ref 0–46)
Albumin/Globulin Ratio: 1.2 (ref 1.00–2.70)
Albumin: 3.6 g/dL (ref 3.5–5.2)
Alk Phosphatase: 61 U/L (ref 35–117)
Anion Gap: 11 mmol/L (ref 2–17)
BUN: 7 mg/dL — ABNORMAL LOW (ref 8–23)
CO2: 24 mmol/L (ref 22–29)
Calcium: 8.7 mg/dL (ref 8.5–10.7)
Chloride: 104 mmol/L (ref 98–107)
Creatinine: 0.6 mg/dL (ref 0.5–1.0)
Est, Glom Filt Rate: 100 mL/min/1.73mÂ² (ref >=60)
Globulin: 3 g/dL (ref 1.9–4.4)
Glucose: 92 mg/dL (ref 70–99)
Osmolaliy Calculated: 275 mosm/kg (ref 270–287)
Potassium: 3.8 mmol/L (ref 3.5–5.3)
Sodium: 139 mmol/L (ref 135–145)
Total Bilirubin: 0.19 mg/dL (ref 0.00–1.20)
Total Protein: 6.6 g/dL (ref 5.7–8.3)

## 2023-09-14 LAB — LIPASE: Lipase: 21 U/L (ref 13–60)

## 2023-09-14 MED ORDER — ONDANSETRON HCL 4 MG PO TABS
4 MG | ORAL_TABLET | Freq: Three times a day (TID) | ORAL | 0 refills | Status: DC | PRN
Start: 2023-09-14 — End: 2023-11-07

## 2023-09-14 NOTE — Progress Notes (Signed)
 Chief Complaint  Chief Complaint   Patient presents with    Nausea & Vomiting    Abdominal Pain    Headache       HISTORY OF PRESENT ILLNESS  History of Present Illness  The patient is a 63 year old female who presents for evaluation of gastrointestinal symptoms.    She reports a general feeling of malaise, which began on Saturday night. She experienced an episode of vomiting at approximately 5:00 AM, preceded by a night spent in the bathroom. The following day, she felt nauseous, weak, and drowsy, with a loss of appetite and a headache. Despite consuming oatmeal this morning, her symptoms persist. She also reports generalized body aches and a mild headache that started yesterday. She has been managing the headache with acetaminophen. She reports no sore throat, congestion, or runny nose but does experience some sinus pressure and dryness in her nose. She suspects she may have had diarrhea on Saturday morning. She reports no dysuria. She still has her gallbladder and does not believe her symptoms are related to food intake. She has a history of constipation, but this has not been an issue recently. She reports no recent illness in her household and notes that her family has been consuming the same food. She was scheduled for eyelid surgery on Wednesday but is considering postponing it due to her current health status. She last vomited on Saturday morning at 5:00 AM. She took Pepto-Bismol around 6:00 AM on Saturday and has not taken any other medication for nausea since then. She did not eat anything yesterday but consumed water and a small amount of Gatorade.    She also reports mild pain on her right side, specifically under the rib.    FAMILY HISTORY  Her father has a history of diverticulitis.    MEDICATIONS  Current: acetaminophen, Pepto-Bismol  Assessment & Plan    Assessment & Plan  1. Viral gastroenteritis.  The clinical presentation suggests a viral etiology, given the absence of significant findings on  physical examination. There is no evidence to support a diagnosis of diverticulitis or acute cholecystitis. The possibility of gallbladder colic can not be ruled out, but the overall symptomatology leans towards a viral cause. The presence of hot flashes, sweats, and general malaise further supports this assessment. A potential food poisoning event was considered but deemed less likely. She is advised to maintain hydration with Sprite, Gatorade, and water. A BRAT diet (bananas, rice, applesauce, and toast) is recommended for its ease of digestion. She should gradually increase her food intake as tolerated. Blood work will be conducted to rule out any underlying conditions. A prescription for Zofran 4 mg has been provided to manage nausea. She is instructed to take Tylenol or Motrin as needed for headaches. If there is any worsening of abdominal pain, a CT scan will be considered.    2. Right-sided abdominal pain.  The patient reports mild pain on the right side under the rib. There is no tenderness upon examination, and the pain does not seem to be associated with eating. The pain could be related to intermittent gallbladder cramping or gallbladder colic. Liver function tests will be conducted to ensure there are no underlying issues with the gallbladder. If symptoms persist or worsen, further evaluation, including a possible CT scan, will be considered.    3. Suspected influenza B.  Given the current prevalence of influenza B in the community and the patient's symptoms of muscle aches, headache, and general malaise, testing for influenza A  and B will be conducted.    1. Gastroenteritis  -     CBC with Auto Differential; Future  -     Comprehensive Metabolic Panel; Future  -     Lipase; Future  -     ondansetron (ZOFRAN) 4 MG tablet; Take 1 tablet by mouth 3 times daily as needed for Nausea or Vomiting, Disp-30 tablet, R-0Normal  -     Urinalysis W/ Rflx Microscopic; Future  2. Malaise and fatigue  -     Urinalysis  W/ Rflx Microscopic; Future    Flu swab negative  Labs all within normal limits.  Likely viral gastroenteritis with no other acute abnormalities noted on laboratory data        09/14/2023    11:09 AM 10/30/2022    11:41 AM 10/23/2021     4:06 PM   PHQ Scores   PHQ2 Score 0 0 0   PHQ9 Score 0 0 0     Interpretation of Total Score Depression Severity: 1-4 = Minimal depression, 5-9 = Mild depression, 10-14 = Moderate depression, 15-19 = Moderately severe depression, 20-27 = Severe depression   The diagnoses and plan were discussed with the patient, who verbalizes understanding and agrees with plan.  All questions answered.      Patient Instructions   If symptoms do not continue to improve or they worsen, or you have fevers or other concerns let us know.  Will probably order a CT scan at that point.  Will check your liver enzymes and your blood work today.    Zofran for nausea.    The BRAT diet stands for Bananas, Rice, Applesauce, and Toast. It is a bland, low-fiber diet that is often recommended for people with gastrointestinal issues such as diarrhea, nausea, and vomiting.   To provide easily digestible foods that can help settle the stomach.   To reduce the amount of fiber and fat in the diet, which can irritate the digestive system.   To allow the gastrointestinal tract to rest and heal.   Foods Included in the BRAT Diet:   Bananas, Rice, Applesauce, Toast, Clear broth, Jell-O, and Oatmeal.   Other Considerations:   The BRAT diet is typically a short-term diet, lasting for 1-2 days.   It is important to drink plenty of fluids to prevent dehydration.   Avoid spicy, greasy, or acidic foods while on the BRAT diet.   If symptoms do not improve or worsen after 2 days, consult a healthcare professional.  Note: The BRAT diet is not a complete and balanced diet. It is intended to be a temporary measure to relieve gastrointestinal symptoms. It is important to return to a regular, healthy diet as soon as possible.     Active  medications at end of Visit  Current Outpatient Medications   Medication Sig Dispense Refill    ondansetron (ZOFRAN) 4 MG tablet Take 1 tablet by mouth 3 times daily as needed for Nausea or Vomiting 30 tablet 0    ibandronate (BONIVA) 150 MG tablet TAKE 1 TABLET BY MOUTH EVERY DAY 3 tablet 3    magnesium oxide (MAG-OX) 400 MG tablet Take 1 tablet by mouth daily 90 tablet 3    Cholecalciferol (VITAMIN D3) 50 MCG (2000 UT) CAPS Take 2 capsules by mouth daily 180 capsule 3    calcium-vitamin D (OSCAL-500) 500-5 MG-MCG TABS per tablet Take 2 tablets by mouth daily 180 tablet 3    metroNIDAZOLE (METROCREAM) 0.75 % cream Apply TO affected  AREA 1-2 times a DAY       No current facility-administered medications for this visit.          No follow-ups on file.       REVIEW OF SYSTEMS  Review of Systems   Constitutional:  Positive for chills, diaphoresis and fatigue.   HENT:  Positive for congestion.    Gastrointestinal:  Positive for abdominal pain, diarrhea, nausea and vomiting.   Neurological:  Positive for weakness.        PHYSICAL EXAMINATION  Vitals:    09/14/23 1107   BP: 130/80   BP Site: Right Upper Arm   Patient Position: Sitting   BP Cuff Size: Large Adult   Pulse: 67   Temp: 98 F (36.7 C)   TempSrc: Temporal   SpO2: 98%   Weight: 87.1 kg (192 lb)     Body mass index is 31.95 kg/m.    Physical Exam  Vitals reviewed.   Constitutional:       Appearance: She is ill-appearing.   HENT:      Head: Normocephalic and atraumatic.      Right Ear: Tympanic membrane normal.      Left Ear: There is impacted cerumen.      Nose: Congestion present.      Mouth/Throat:      Mouth: Mucous membranes are moist.   Cardiovascular:      Rate and Rhythm: Normal rate and regular rhythm.      Pulses: Normal pulses.      Heart sounds: Normal heart sounds.   Pulmonary:      Effort: Pulmonary effort is normal.      Breath sounds: Normal breath sounds.   Abdominal:      General: There is no distension.      Palpations: Abdomen is soft.       Tenderness: There is no abdominal tenderness. There is no right CVA tenderness, left CVA tenderness or rebound.      Comments: Hypoactive BS     Musculoskeletal:         General: Normal range of motion.      Cervical back: Normal range of motion.   Skin:     General: Skin is warm.   Neurological:      General: No focal deficit present.      Mental Status: She is alert and oriented to person, place, and time. Mental status is at baseline.   Psychiatric:         Mood and Affect: Mood normal.         Behavior: Behavior normal.         Thought Content: Thought content normal.     MEDICATIONS at [facility] of Visit  Current Outpatient Medications on File Prior to Visit   Medication Sig Dispense Refill    ibandronate (BONIVA) 150 MG tablet TAKE 1 TABLET BY MOUTH EVERY DAY 3 tablet 3    magnesium oxide (MAG-OX) 400 MG tablet Take 1 tablet by mouth daily 90 tablet 3    Cholecalciferol (VITAMIN D3) 50 MCG (2000 UT) CAPS Take 2 capsules by mouth daily 180 capsule 3    calcium-vitamin D (OSCAL-500) 500-5 MG-MCG TABS per tablet Take 2 tablets by mouth daily 180 tablet 3    metroNIDAZOLE (METROCREAM) 0.75 % cream Apply TO affected AREA 1-2 times a DAY       No current facility-administered medications on file prior to visit.       ALLERGIES / INTOLERANCES  No Known Allergies    PERTINENT LABS AND IMAGING  Lab Results   Component Value Date/Time    WBC 4.1 09/14/2023 12:27 PM    HGB 13.6 09/14/2023 12:27 PM    PLT 202 09/14/2023 12:27 PM    MCV 95.6 09/14/2023 12:27 PM    NEUTROABS 2.2 09/14/2023 12:27 PM     Results      The patient (or guardian, if applicable) and other individuals in attendance with the patient were advised that Artificial Intelligence will be utilized during this visit to record, process the conversation to generate a clinical note, and support improvement of the AI technology. The patient (or guardian, if applicable) and other individuals in attendance at the appointment consented to the use of AI, including the  recording.

## 2023-09-14 NOTE — Telephone Encounter (Signed)
-  pt states that she has headache   -nausea  -very weak   -Saturday she vomited   -she has pain on her right side   -she states that her symptoms started on Saturday night   -she would like to schedule an appointment for today   -please advise    Sent to [facility]

## 2023-09-14 NOTE — Telephone Encounter (Signed)
 Scheduled for today.

## 2023-09-14 NOTE — Patient Instructions (Addendum)
 If symptoms do not continue to improve or they worsen, or you have fevers or other concerns let us know.  Will probably order a CT scan at that point.  Will check your liver enzymes and your blood work today.    Zofran for nausea.    The BRAT diet stands for Bananas, Rice, Applesauce, and Toast. It is a bland, low-fiber diet that is often recommended for people with gastrointestinal issues such as diarrhea, nausea, and vomiting.   To provide easily digestible foods that can help settle the stomach.   To reduce the amount of fiber and fat in the diet, which can irritate the digestive system.   To allow the gastrointestinal tract to rest and heal.   Foods Included in the BRAT Diet:   Bananas, Rice, Applesauce, Toast, Clear broth, Jell-O, and Oatmeal.   Other Considerations:   The BRAT diet is typically a short-term diet, lasting for 1-2 days.   It is important to drink plenty of fluids to prevent dehydration.   Avoid spicy, greasy, or acidic foods while on the BRAT diet.   If symptoms do not improve or worsen after 2 days, consult a healthcare professional.  Note: The BRAT diet is not a complete and balanced diet. It is intended to be a temporary measure to relieve gastrointestinal symptoms. It is important to return to a regular, healthy diet as soon as possible.

## 2023-09-14 NOTE — Other (Signed)
 Urinalysis normal  CMP within normal limits with normal liver enzymes, kidney function, electrolytes  CBC: Within normal limits with no elevated white blood cell count.  Findings all consistent with a viral disease.

## 2023-11-06 ENCOUNTER — Ambulatory Visit
Admit: 2023-11-06 | Discharge: 2023-11-06 | Payer: PRIVATE HEALTH INSURANCE | Attending: Internal Medicine | Primary: Internal Medicine

## 2023-11-06 VITALS — BP 117/74 | HR 71 | Temp 97.40000°F | Wt 192.6 lb

## 2023-11-06 DIAGNOSIS — Z0000 Encounter for general adult medical examination without abnormal findings: Secondary | ICD-10-CM

## 2023-11-06 MED ORDER — SHINGRIX 50 MCG/0.5ML IM SUSR
50 | Freq: Once | INTRAMUSCULAR | 0 refills | Status: AC
Start: 2023-11-06 — End: 2023-11-06

## 2023-11-06 NOTE — Patient Instructions (Addendum)
 Hey magnesium  Glycinate 400mg  nightly  Melatonin 2-5 mg nightly           Starting a Weight-Loss Plan: Care Instructions  Overview    It can be a challenge to lose weight. But your doctor can help you make a weight-loss plan that meets your needs.  You don't have to make a lot of big changes at once. A better idea might be to focus on small changes and stick with them. When those changes become habit, you can add a few more changes.  Some people find it helpful to take an exercise or nutrition class. If you have questions, ask your doctor about seeing a registered dietitian or an exercise specialist. You might also think about joining a weight-loss support group.  If you're not ready to make changes right now, try to pick a date in the future. Then make an appointment with your doctor to talk about when and how you'll get started with a plan.  Follow-up care is a key part of your treatment and safety. Be sure to make and go to all appointments, and call your doctor if you are having problems. It's also a good idea to know your test results and keep a list of the medicines you take.  How can you care for yourself as you start a weight-loss plan?  Set realistic goals. Many people expect to lose much more weight than is likely. A weight loss of 5% to 10% of your body weight may be enough to improve your health.  Get family and friends involved to provide support. Talk to them about why you are trying to lose weight, and ask them to help. They can help by participating in exercise and having meals with you, even if they may be eating something different.  Find what works best for you. If you do not have time or do not like to cook, a program that offers meal replacement bars or shakes may be better for you. Or if you like to prepare meals, finding a plan that includes daily menus and recipes may be best.  Ask your doctor about other health professionals who can help you achieve your weight-loss goals.  A dietitian can  help you make healthy changes in your diet.  An exercise specialist or personal trainer can help you develop a safe and effective exercise program.  A counselor or psychiatrist can help you cope with issues such as depression, anxiety, or family problems that can make it hard to focus on weight loss.  Consider joining a support group for people who are trying to lose weight. Your doctor can suggest groups in your area.  Where can you learn more?  Go to [employer] and enter U357 to learn more about "Starting a Weight-Loss Plan: Care Instructions."  Current as of: October 28, 2022  Content Version: 14.4   2024-2025 [place], [place].   Care instructions adapted under license by [facility]. If you have questions about a medical condition or this instruction, always ask your healthcare professional. Laren Player, [facility], disclaims any warranty or liability for your use of this information.         Learning About Cutting Calories  How do calories affect your weight?  Food gives your body energy. Energy from the food you eat is measured in calories. This energy keeps your heart beating, your brain active, and your muscles working.  Your body needs a certain number of calories each day. After your body uses the  calories it needs, it stores extra calories as fat.  To lose weight safely, you have to eat fewer calories while eating in a healthy way.  How many calories do you need each day?  The more active you are, the more calories you need. When you are less active, you need fewer calories. How many calories you need each day also depends on several things, including your age and whether you are female or female.  Here are some general guidelines for adults:  Less active women and older adults need 1,600 to 2,000 calories each day.  Active women and less active men need 2,000 to 2,400 calories each day.  Active men need 2,400 to 3,000 calories each day.  How can you cut calories and eat  healthy meals?  Whole grains, vegetables and fruits, and dried beans are good lower-calorie foods. They give you lots of nutrients and fiber. And they fill you up.  Sweets, energy drinks, and soda pop are high in calories. They give you few nutrients and no fiber. Try to limit soda pop, fruit juice, and energy drinks. Drink water instead.  Some fats can be part of a healthy diet. But cutting back on fats from highly processed foods like fast foods and many snack foods is a good way to lower the calories in your diet. Also, use smaller amounts of fats like butter, margarine, salad dressing, and mayonnaise. Add fresh garlic, lemon, or herbs to your meals to add flavor without adding fat.  Meats and dairy products can be a big source of hidden fats. Try to choose lean or low-fat versions of these products.  Fat-free cookies, candies, chips, and frozen treats can still be high in sugar and calories. Some fat-free foods have more calories than regular ones. Eat fat-free treats in moderation, as you would other foods.  If your favorite foods are high in fat, salt, sugar, or calories, limit how often you eat them. Eat smaller servings, or look for healthy substitutes. Fill up on fruits, vegetables, and whole grains.  Eating at home  Use meat as a side dish instead of as the main part of your meal.  Try main dishes that use whole wheat pasta, brown rice, dried beans, or vegetables.  Find ways to cook with little or no fat, such as broiling, steaming, or grilling.  Use cooking spray instead of oil. If you use oil, use a monounsaturated oil, such as canola or olive oil.  Trim fat from meats before you cook them.  Drain off fat after you brown the meat or while you roast it.  Chill soups and stews after you cook them. Then skim the fat off the top after it hardens.  Eating out  Order foods that are broiled or poached rather than fried or breaded.  Cut back on the amount of butter or margarine that you use on bread.  Order  sauces, gravies, and salad dressings on the side, and use only a little.  When you order pasta, choose tomato sauce rather than cream sauce.  Ask for salsa with your baked potato instead of sour cream, butter, cheese, or bacon.  Order meals in a small size instead of upgrading to a large.  Share an entree, or take part of your food home to eat as another meal.  Share appetizers and desserts.  Where can you learn more?  Go to [employer] and enter V914 to learn more about "Learning About Cutting Calories."  Current as of: April 06, 2023  Content Version: 14.4   2024-2025 [place], [place].   Care instructions adapted under license by [facility]. If you have questions about a medical condition or this instruction, always ask your healthcare professional. Laren Player, [facility], disclaims any warranty or liability for your use of this information.         Learning About Low-Carbohydrate Diets  What is a low-carbohydrate diet?     A low-carbohydrate (or "low-carb") diet limits foods and drinks that have carbohydrates. This includes grains, fruits, milk and yogurt, and starchy vegetables like potatoes, beans, and corn. It also avoids foods and drinks that have added sugar. Instead, low-carb diets include foods that are high in protein and fat.  Why might you follow a low-carb diet?  Low-carb diets may be used for a variety of reasons, such as for weight loss. People who have diabetes may use a low-carb diet to help manage their blood sugar levels.  What should you do before you start the diet?  Talk to your doctor before you try any diet. This is even more important if you have health problems like kidney disease, heart disease, or diabetes. Your doctor may suggest that you meet with a registered dietitian. A dietitian can help you make an eating plan that works for you.  What foods do you eat on a low-carb diet?  On a low-carb diet, you choose foods that are high in protein and fat.  Examples of these are:  Meat, poultry, and fish.  Eggs.  Nuts, such as walnuts, pecans, almonds, and peanuts.  Peanut butter and other nut butters.  Tofu.  Avocado.  Olives.  Non-starchy vegetables like broccoli, cauliflower, green beans, mushrooms, peppers, lettuce, and spinach.  Unsweetened non-dairy milks like almond milk and coconut milk.  Cheese, cottage cheese, and cream cheese.  Where can you learn more?  Go to [employer] and enter C335 to learn more about "Learning About Low-Carbohydrate Diets."  Current as of: April 06, 2023  Content Version: 14.4   2024-2025 [place], [place].   Care instructions adapted under license by [facility]. If you have questions about a medical condition or this instruction, always ask your healthcare professional. Laren Player, [facility], disclaims any warranty or liability for your use of this information.         Well Visit, Ages 55 to 43: Care Instructions  Well visits can help you stay healthy. Your doctor has checked your overall health and may have suggested ways to take good care of yourself. Your doctor also may have recommended tests. You can help prevent illness with healthy eating, good sleep, vaccinations, regular exercise, and other steps.    Get the tests that you and your doctor decide on. Depending on your age and risks, examples might include screening for diabetes; hepatitis C; HIV; and cervical, breast, lung, and colon cancer. Screening helps find diseases before any symptoms appear.   Eat healthy foods. Choose fruits, vegetables, whole grains, lean protein, and low-fat dairy foods. Limit saturated fat and reduce salt.     Limit alcohol. Men should have no more than 2 drinks a day. Women should have no more than 1. For some people, no alcohol is the best choice.   Exercise. Get at least 30 minutes of exercise on most days of the week. Walking can be a good choice.     Reach and stay at your healthy weight. This will lower your  risk for many health problems.   Take care of  your mental health. Try to stay connected with friends, family, and community, and find ways to manage stress.     If you're feeling depressed or hopeless, talk to someone. A counselor can help. If you don't have a counselor, talk to your doctor.   Talk to your doctor if you think you may have a problem with alcohol or drug use. This includes prescription medicines, marijuana, and other drugs.     Avoid tobacco and nicotine: Don't smoke, vape, or chew. If you need help quitting, talk to your doctor.   Practice safer sex. Getting tested, using condoms or dental dams, and limiting sex partners can help prevent STIs.     Use birth control if it's important to you to prevent pregnancy. Talk with your doctor about your choices and what might be best for you.   Prevent problems where you can. Protect your skin from too much sun, wash your hands, brush your teeth twice a day, and wear a seat belt in the car.   Where can you learn more?  Go to [employer] and enter P072 to learn more about "Well Visit, Ages 67 to 23: Care Instructions."  Current as of: October 28, 2022  Content Version: 14.4   2024-2025 [place], [place].   Care instructions adapted under license by [facility]. If you have questions about a medical condition or this instruction, always ask your healthcare professional. Laren Player, [facility], disclaims any warranty or liability for your use of this information.

## 2023-11-06 NOTE — Progress Notes (Signed)
 11/06/2023  Chief Complaint   Patient presents with    Annual Exam       Julie Garner (DOB:  07/19/1960) is a63 y.o. female, here for a preventive medicine evaluation.  History of Present Illness  The patient presents for evaluation of weight gain, sleep disturbance, and a lump on the nose.    She has been experiencing difficulty in maintaining her weight, despite a loss of approximately 15 pounds last year. Her weight has fluctuated between 190 and 192 since the initiation of our consultations, with a peak of nearly 220. She has reduced her food intake to about one-third of her previous consumption, adhering to a diet of oatmeal with blueberries and honey for breakfast, salad with chicken for lunch, and a balanced dinner of meat, vegetables, and starch. She abstains from snacking and consumes water throughout the day, with coffee in the morning accompanied by an oat milk soft creamer containing 4 g of sugar. She engages in physical activity, walking between 4000 to 8000 steps daily and attending [employer] classes twice a week. She has been cautious with weightlifting due to a previous L4 compression fracture. Despite these efforts, she has not observed any weight loss, even after a period of 4 days without eating due to a viral infection in 08/2023. She has been practicing intermittent fasting, consuming meals within an 8-hour window.    Her sleep is frequently disrupted due to her partner's nocturnal activities, necessitating the use of ear plugs. She also experiences hot flashes at night, which have been more frequent recently, possibly due to an increase in thermostat temperature from 68 to 72. These hot flashes last for about 20 seconds, during which she removes her sheets, but she quickly becomes cold again and requires the covers. She has been experiencing hot flashes for the past 4 to 5 years. She has previously used Advil PM for sleep disturbances but discontinued its use due to potential memory effects. She  has tried melatonin and doxylamine succinate, but both caused stomach upset. She typically requires 6 hours of sleep to feel rested but has been waking up multiple times during the night over the past several nights.    She has observed a new lump on her nose, which she first noticed this morning. She has a history of basal cell carcinoma diagnosed 6 years ago. The lump frequently develops into a pimple. She has an appointment with her dermatologist, Dr. Toniann Franklin, scheduled for next month.    She has not received her second shingles vaccine yet. She had two root canals in the last two months and is currently experiencing numbness on the left side of her mouth. She wants to wait until the infection clears up before getting the vaccine. She had a mammogram in 05/2023 at [facility]. She alternates between mammograms and MRIs every six months. She is still taking Boniva .    FAMILY HISTORY  She has no family history of thyroid cancer.      ASSESSMENT/PLAN:  Assessment & Plan  1. Weight gain.  Her weight has remained stable at approximately 190-192 since the onset of our consultations. The challenge of weight maintenance or loss at this stage of life was discussed, attributing it primarily to age-related decline in metabolic rate rather than menopause. The potential benefits and drawbacks of various weight loss medications were explored, including their appetite-suppressing effects and potential side effects such as constipation, dehydration, and rare cases of pancreatitis and depression. The importance of  regular exercise and adequate protein intake was emphasized. She was advised to conduct a strict calorie count for a week to identify her caloric intake. A prescription for Zepbound was provided, contingent on insurance coverage.    2. Sleep disturbance.  Her sleep is frequently disrupted due to her partner's nocturnal activities, necessitating the use of ear plugs. She also  experiences hot flashes at night, which have been more frequent recently, possibly due to an increase in thermostat temperature from 68 to 72. These hot flashes last for about 20 seconds, during which she removes her sheets, but she quickly becomes cold again and requires the covers. She has been experiencing hot flashes for the past 4 to 5 years. The potential benefits of Advil PM or Tylenol PM were discussed, along with the risks associated with hypnotics such as Ambien and Benadryl. She was advised to take Advil PM or Tylenol PM once or twice a week if necessary. The use of magnesium  glycinate and melatonin was also recommended.    3. Lump on nose.  She has observed a new lump on her nose, which she first noticed this morning. She has a history of basal cell carcinoma diagnosed 6 years ago. The lump frequently develops into a pimple. An expedited referral to dermatology was arranged for further evaluation.    4. Health maintenance.  She has not received her second shingles vaccine yet. She had two root canals in the last two months and is currently experiencing numbness on the left side of her mouth. She wants to wait until the infection clears up before getting the vaccine. She had a mammogram in December 2024 [facility]. She alternates between mammograms and MRIs every six months. She is still taking Boniva . Her colonoscopy results from 2020 were normal, indicating that the next one is not due until 2030. Her most recent mammogram was conducted in June 2024. Her bone density test is scheduled for June 2025.    1. Encounter for well adult exam without abnormal findings  Overview:  Advance Care Planning   HCPOA:Caroline Klinke - DTR;   Living Will: yes   Code Status: Full Code     Shingles: Needs second shot  Last covid 2022  Colonoscopy: Normal September 2021 repeat in 2031 years  GYN: Followed by Dr. 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 Shirlie Dove    Orders:  -     Leatha Province, MD - Dermatology  3. Class 2 obesity  Assessment & Plan:  Discussed weight loss strategies, calorie count, calorie reduction.  Patient states she eats limited calorie diet as is but still does not feel like she is able to lose weight she is maintaining her weight at this time.  She is trying to exercise regularly but has not recently since the end of tax season.  We discussed GLP-1.  Discussed risk and benefits.  Orders:  -     PR Behavior counsel obesity (8-15 min) [G0447]  4. Lumbar compression fracture, sequela  Overview:  Noted on x-ray 2018  Orders:  -     DEXA BONE DENSITY AXIAL SKELETON; Future  5. Localized osteoporosis with current pathological fracture, sequela  Overview:  History of vertebral fracture.  DEXA scan shows osteopenia.  Repeat DEXA in June 2025  Orders:  -     DEXA BONE DENSITY AXIAL SKELETON; Future  6. Need for prophylactic vaccination and inoculation against varicella  -     zoster recombinant adjuvanted vaccine (SHINGRIX ) 50 MCG/0.5ML SUSR injection; Inject 0.5 mLs into the muscle once for 1 dose, Disp-0.5 mL, R-0Print  7. Insomnia associated with menopause  Assessment & Plan:  She is not a candidate for estrogen therapy secondary to strong family history of breast cancer  Discussed melatonin and magnesium  glycinate  I did consider SSRIs though concerned about risk of weight gain      Obesity Counseling: Assessed behavioral health risks and factors affecting choice of behavior. Suggested weight control approaches, including dietary changes behavioral modification and follow up plan. Provided educational and support documentation.  Time spent (minutes): 8    Return in 1 year (on 11/05/2024) for CPE (Physical Exam), Routine - 6 month.    CardioVasc Risk  The ASCVD Risk score (Arnett DK, et al., 2019) failed to  calculate for the following reasons:    Unable to determine if patient is Non-Hispanic African American    Review of Systems   Constitutional: Negative.  Negative for appetite change, chills, fever and unexpected weight change.   HENT:  Negative for congestion, ear pain, hearing loss, sinus pain and sore throat.    Eyes: Negative.  Negative for pain, discharge and visual disturbance.   Respiratory:  Negative for cough, shortness of breath and wheezing.    Cardiovascular:  Negative for chest pain and palpitations.   Gastrointestinal:  Negative for abdominal pain, diarrhea, nausea and vomiting.   Endocrine: Positive for heat intolerance (hot flashes). Negative for cold intolerance and polydipsia.   Genitourinary:  Negative for difficulty urinating, dysuria, frequency and hematuria.   Musculoskeletal:  Negative for arthralgias.   Skin:  Negative for rash.   Neurological:  Negative for dizziness, weakness and headaches.   Hematological:  Negative for adenopathy.   Psychiatric/Behavioral:  Positive for sleep disturbance.        Prior to Visit Medications    Medication Sig Taking? 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    Acute conjunctivitis of left eye 02/16/2021    Diffuse cystic mastopathy in my 20s    History of shingles 1990    Slow transit constipation 10/23/2021    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       Past Surgical History:   Procedure Laterality Date    MOHS SURGERY  2018       Social History      Socioeconomic History    Marital status: Married     Spouse name: Not on file    Number of children: Not on file    Years of education: Not on file    Highest education level: Not on file   Occupational History    Not on file  Tobacco Use    Smoking status: Never    Smokeless tobacco: Never   Vaping Use    Vaping status: Never Used   Substance and Sexual Activity    Alcohol use: Not Currently    Drug use: Not Currently    Sexual activity: Yes     Partners: Male   Other Topics Concern    Not on file   Social History Narrative    Not on file     Social Drivers of Health     Financial Resource Strain: Not on file   Food Insecurity: Not on file   Transportation Needs: Not on file   Physical Activity: Not on file   Stress: Not on file   Social Connections: Not on file   Intimate Partner Violence: Not on file   Housing Stability: Not on file        Family History   Problem Relation Age of Onset    Breast Cancer Mother 76        deceased at 16 yo    Macular Degen Father     Diabetes type 2  Father     Heart Failure Father     Skin Cancer Father         BCC    Breast Cancer Sister 82        deceased at 58 yo    Breast Cancer Sister 52        ER+    Cancer Maternal Grandmother         unknown    Bone Cancer Maternal Grandfather     Cerebral Aneurysm Paternal Grandmother     Cancer Paternal Grandfather     Breast Cancer Maternal Aunt 56    Brain Cancer Paternal Uncle     Breast Cancer Maternal Aunt        ADVANCE DIRECTIVE: N, <no information>    Vitals:    11/06/23 1251   BP: 117/74   Pulse: 71   Temp: 97.4 F (36.3 C)   SpO2: 97%   Weight: 87.4 kg (192 lb 9.6 oz)     Estimated body mass index is 32.05 kg/m as calculated from the following:    Height as of 06/18/23: 1.651 m (5\' 5" ).    Weight as of this encounter: 87.4 kg (192 lb 9.6 oz).      Physical Exam  Constitutional:       Appearance: Normal appearance. She is normal weight.   HENT:      Head: Normocephalic and atraumatic.      Right Ear: Tympanic membrane  normal.      Left Ear: Tympanic membrane normal.      Nose: Nasal deformity present.      Comments: Bump on front of nose       Mouth/Throat:      Mouth: Mucous membranes are moist.      Pharynx: Oropharynx is clear.   Eyes:      Extraocular Movements: Extraocular movements intact.      Conjunctiva/sclera: Conjunctivae normal.      Pupils: Pupils are equal, round, and reactive to light.   Cardiovascular:      Rate and Rhythm: Normal rate and regular rhythm.      Pulses: Normal pulses.      Heart sounds: Normal heart sounds.   Pulmonary:      Effort: Pulmonary effort is normal.      Breath sounds: Normal breath sounds.   Abdominal:  General: Abdomen is flat. Bowel sounds are normal.      Palpations: Abdomen is soft.   Musculoskeletal:         General: No swelling. Normal range of motion.      Cervical back: Normal range of motion and neck supple.   Skin:     General: Skin is warm and dry.   Neurological:      General: No focal deficit present.      Mental Status: She is alert and oriented to person, place, and time.      Cranial Nerves: No cranial nerve deficit.      Sensory: No sensory deficit.      Motor: No weakness.   Psychiatric:         Mood and Affect: Mood normal.         Behavior: Behavior normal.         Thought Content: Thought content normal.         Judgment: Judgment normal.             Latest Ref Rng & Units 09/14/2023    12:27 PM 10/25/2021     8:45 AM   [facility]   A1C 4.0 - 6.0 %  5.6    A1C POC 4.0 - 6.0 %  5.6    GLU random 70 - 99 mg/dL 92  161    CHOL 096 - 045 mg/dL  409    TRIG 0 - 811 mg/dL  44    HDL >=91 mg/dL  60    LDL CALC 0.0 - 478.2 mg/dL  956.2    NA 130 - 865 mmol/L 139  141    K 3.5 - 5.3 mmol/L 3.8  4.2    BUN 8 - 23 mg/dL 7  11    CR 0.5 - 1.0 mg/dL 0.6  0.6    GFR >=78 IO/NGE/9.52W 100  101    CA 8.5 - 10.7 mg/dL 8.7  9.0    ALT 0 - 42 unit/L 20  17    AST 0 - 46 unit/L 25  18    TSH 0.358 - 3.740 mcIU/mL  1.350    HGB 11.5 - 15.7 g/dL 41.3  24.4        Lab Results    Component Value Date/Time    CHOL 193 10/25/2021 08:45 AM    TRIG 44 10/25/2021 08:45 AM    HDL 60 10/25/2021 08:45 AM    GLUCOSE 92 09/14/2023 12:27 PM    LABA1C 5.6 10/25/2021 08:45 AM     Results  Labs   - LDL: 119 mg/dL   - Thyroid Function Test: Normal   - Complete Blood Count (CBC): Normal   - Electrolytes: Normal   - Kidney Function Test: Normal   - Blood Glucose Test: Normal    Immunization History   Administered Date(s) Administered    COVID-19, PFIZER GRAY top, DO NOT Dilute, (age 7 y+), IM, 30 mcg/0.3 mL 09/17/2019, 10/08/2019, 06/05/2020, 10/07/2020    Influenza Virus Vaccine 04/11/2022    Influenza, AFLURIA (age 59 y+), FLUZONE, (age 49 mo+), Quadv MDV, 0.5mL 04/09/2022    Influenza, FLUCELVAX, (age 9 mo+) IM, Trivalent PF, 0.5mL 05/13/2023    Zoster Recombinant (Shingrix ) 06/05/2020       Health Maintenance   Topic Date Due    Pneumococcal 50+ years Vaccine (1 of 1 - PCV) Never done    Shingles vaccine (2 of 2) 07/31/2020    Cervical  cancer screen  Never done    COVID-19 Vaccine (5 - 2024-25 season) 03/01/2023    DTaP/Tdap/Td vaccine (1 - Tdap) 10/23/2065 (Originally 08/26/1979)    Breast cancer screen  12/22/2023    Depression Screen  09/13/2024    Lipids  10/26/2026    Colorectal Cancer Screen  03/21/2030    Respiratory Syncytial Virus (RSV) Pregnant or age 72 yrs+ (1 - 1-dose 75+ series) 08/26/2034    Flu vaccine  Completed    Hepatitis A vaccine  Aged Out    Hepatitis B vaccine  Aged Out    Hib vaccine  Aged Out    Polio vaccine  Aged Out    Meningococcal (ACWY) vaccine  Aged Out    Meningococcal B vaccine  Aged Out    Diabetes screen  Discontinued    Hepatitis C screen  Discontinued    HIV screen  Discontinued         An electronic signature was used to authenticate this note.    --Patric Booker, MD on 11/07/2023 at 9:14 AM      The patient (or guardian, if applicable) and other individuals in attendance with the patient were advised that Artificial Intelligence will be utilized during this visit to  record, process the conversation to generate a clinical note, and support improvement of the AI technology. The patient (or guardian, if applicable) and other individuals in attendance at the appointment consented to the use of AI, including the recording.

## 2023-11-07 NOTE — Assessment & Plan Note (Addendum)
 She is not a candidate for estrogen therapy secondary to strong family history of breast cancer  Discussed melatonin and magnesium  glycinate  I did consider SSRIs though concerned about risk of weight gain

## 2023-11-07 NOTE — Assessment & Plan Note (Signed)
 Discussed weight loss strategies, calorie count, calorie reduction.  Patient states she eats limited calorie diet as is but still does not feel like she is able to lose weight she is maintaining her weight at this time.  She is trying to exercise regularly but has not recently since the end of tax season.  We discussed GLP-1.  Discussed risk and benefits.

## 2023-12-23 ENCOUNTER — Encounter: Payer: PRIVATE HEALTH INSURANCE | Attending: Physician Assistant | Primary: Internal Medicine

## 2023-12-23 ENCOUNTER — Ambulatory Visit: Payer: PRIVATE HEALTH INSURANCE | Primary: Internal Medicine

## 2023-12-31 ENCOUNTER — Telehealth

## 2023-12-31 NOTE — Telephone Encounter (Signed)
 Patient is aware of benign mammogram results from [facility]. MRI due 05/2024.

## 2023-12-31 NOTE — Telephone Encounter (Signed)
 [facility] to [facility]

## 2024-01-08 NOTE — Telephone Encounter (Signed)
 Pt advised.

## 2024-04-22 ENCOUNTER — Encounter

## 2024-04-22 MED ORDER — OYSTER SHELL CALCIUM W/D 500-5 MG-MCG PO TABS
500-5 | ORAL_TABLET | Freq: Every day | ORAL | 3 refills | 30.00000 days | Status: AC
Start: 2024-04-22 — End: ?

## 2024-04-26 NOTE — Telephone Encounter (Signed)
"  Pt states that she needs to cancel and r/s her 05/05/24 appt       Sent to [facility]   "

## 2024-04-27 NOTE — Telephone Encounter (Signed)
"  Patient rescheduled for 11/18 at 11:00 am.  "

## 2024-05-02 ENCOUNTER — Encounter

## 2024-05-05 ENCOUNTER — Inpatient Hospital Stay: Admit: 2024-05-05 | Payer: PRIVATE HEALTH INSURANCE | Attending: Internal Medicine | Primary: Internal Medicine

## 2024-05-05 DIAGNOSIS — S32000S Wedge compression fracture of unspecified lumbar vertebra, sequela: Principal | ICD-10-CM

## 2024-05-05 DIAGNOSIS — M8080XS Other osteoporosis with current pathological fracture, unspecified site, sequela: Secondary | ICD-10-CM

## 2024-05-13 ENCOUNTER — Encounter: Attending: Internal Medicine | Primary: Internal Medicine

## 2024-05-17 ENCOUNTER — Ambulatory Visit
Admit: 2024-05-17 | Discharge: 2024-05-17 | Payer: PRIVATE HEALTH INSURANCE | Attending: Internal Medicine | Primary: Internal Medicine

## 2024-05-17 VITALS — BP 132/75 | HR 51 | Temp 98.10000°F | Wt 195.4 lb

## 2024-05-17 DIAGNOSIS — Z23 Encounter for immunization: Principal | ICD-10-CM

## 2024-05-17 DIAGNOSIS — E559 Vitamin D deficiency, unspecified: Secondary | ICD-10-CM

## 2024-05-17 NOTE — Progress Notes (Signed)
 "Chief Complaint  Chief Complaint   Patient presents with    Follow-up       HISTORY OF PRESENT ILLNESS  63 y.o. female presents today for follow-up on: 56-month follow-up.  History of Present Illness  The patient is a 63 year old female who presents for a 39-month follow-up.    Boniva  Treatment  - Reports no adverse effects from her Boniva  treatment  - Has resumed gym activities post-bone density test  - Incorporates daily walks of 1 to 3 miles into her routine    Foot Swelling  - Occurs during prolonged periods of sitting, particularly during the busy season at work  - Attempts to mitigate this by increasing her walking    Sleep Patterns  - Irregular sleep patterns, ranging from 4 to 8 hours per night  - Occasional hot flashes occur, and she attempts to return to sleep  - If unsuccessful, she rises, showers, and typically falls back asleep within 30 minutes  - Resorts to Advil PM once every 2 weeks if sleep eludes her for 3 to 4 nights  - Husband's nocturnal bathroom visits and hiccups at 4 AM disrupt her sleep  - Breathe Right strips have not been tried    Rosacea  - Not currently using any topical treatments  - Maintains a monthly facial regimen  - Uses a different face lotion, which she finds beneficial    Nasal Bump  - Evaluated by dermatology in May 2025 and deemed non-problematic    Breast Clinic Care  - Under the care of a breast clinic  - Has an upcoming appointment next month    Menopause  - Occurred around the age of 1    Vaccinations  - Has not received her pneumonia vaccine  - Has received 2 shingles vaccines over 4 years apart.  - Significant reaction occurred after her first shingles vaccine    Occupation: Accountant  Diet: Oatmeal and blueberries with honey every morning  Sleep: Irregular sleep pattern, ranging from 4 to 8 hours per night    FAMILY HISTORY  - Family history of breast cancer  Assessment & Plan      1. Vitamin D  deficiency  Overview:  on vitamin D  4000 units daily.  Follow-up in 6  months  Orders:  -     Vitamin D  25 Hydroxy; Future  2. Localized osteoporosis with current pathological fracture, sequela  Overview:  History of vertebral fracture.  DEXA scan shows osteopenia.  DEXA scan 05/05/2024 (12/03/2021)  Lumbar spine T-score -1.7 (-1.7)  Total hip T-score -1.3 (-1.6)  Femoral neck T-score -2.1 (-2.2)    Assessment & Plan:  Follow-up on vitamin D  levels  Tolerating Boniva  without difficulty will plan on continuing for 5 years  Continue weightbearing exercises, calcium  supplementation, vitamin D .  Orders:  -     Vitamin D  25 Hydroxy; Future  3. Rosacea  Overview:  Stable.    Assessment & Plan:  - Managing with monthly facials and herbal treatments  - No topical medications currently used   4. Insomnia associated with menopause  Assessment & Plan:  - Poor sleep, averaging 4 to 7 hours per night  - Occasionally takes Advil PM when unable to sleep for multiple nights in a row (approximately once every two weeks)  - Usage is infrequent and not expected to have detrimental effects   - Denies any significant hot flashes at this time  5. Laboratory exam ordered as part of routine general medical examination  -  TSH reflex to FT4; Future  -     Vitamin D  25 Hydroxy; Future  -     Hemoglobin A1C; Future  -     Lipid Panel; Future  -     Urinalysis W/ Rflx Microscopic; Future  -     CBC with Auto Differential; Future  -     Comprehensive Metabolic Panel; Future  6. Malaise and fatigue  -     TSH reflex to FT4; Future  -     Urinalysis W/ Rflx Microscopic; Future  7. Flu vaccine need  -     Influenza, FLUCELVAX Trivalent, (age 76 mo+) IM, Preservative Free, 0.5mL    Peripheral edema:  - Swelling in feet during prolonged periods of sitting, especially during busy work seasons  - Advised to use compression stockings during these times to manage swelling and prevent future complications      - Weight stable, fluctuating between 196 and 190 pounds  - Blood pressure readings within normal range  - CMP  results from 10/29/2023 within normal limits, including electrolytes, kidney function, and liver function tests. Creatinine: 0.65 mg/dL. Albumin: 4.1 g/dL. Lipid profile: Total cholesterol: 183 mg/dL. Triglycerides: 71 mg/dL. HDL: 51 mg/dL. LDL: 119 mg/dL. Uric acid levels: Normal  - Advised to receive pneumonia vaccine between now and next year  - Advised to get second shingles shot, although not mandatory given previous reaction        09/14/2023    11:09 AM 10/30/2022    11:41 AM 10/23/2021     4:06 PM   PHQ Scores   PHQ2 Score 0  0  0    PHQ9 Score 0  0  0        Data saved with a previous flowsheet row definition     Interpretation of Total Score Depression Severity: 1-4 = Minimal depression, 5-9 = Mild depression, 10-14 = Moderate depression, 15-19 = Moderately severe depression, 20-27 = Severe depression   The diagnoses and plan were discussed with the patient, who verbalizes understanding and agrees with plan.  All questions answered.      There are no Patient Instructions on file for this visit.    Active medications at end of Visit  Current Outpatient Medications   Medication Sig Dispense Refill    Magnesium  Oxide (MAG-OX 400 PO) Take 1 tablet by mouth daily      ondansetron  (ZOFRAN ) 4 MG tablet Take 1 tablet by mouth 3 times daily as needed for Nausea or Vomiting for nausea or vomiting      Calcium  Carb-Cholecalciferol (OYSTER SHELL CALCIUM  W/D) 500-5 MG-MCG TABS tablet Take 2 tablets by mouth daily 180 tablet 3    ibandronate  (BONIVA ) 150 MG tablet TAKE 1 TABLET BY MOUTH EVERY DAY 3 tablet 3    Cholecalciferol (VITAMIN D3) 50 MCG (2000 UT) CAPS Take 2 capsules by mouth daily 180 capsule 3    calcium -vitamin D  (OSCAL-500) 500-5 MG-MCG TABS per tablet Take 2 tablets by mouth daily 180 tablet 3     No current facility-administered medications for this visit.          No follow-ups on file.       REVIEW OF SYSTEMS  Review of Systems   Constitutional: Negative.  Negative for appetite change, chills, fever and  unexpected weight change.   HENT:  Negative for congestion, ear pain, hearing loss, sinus pain and sore throat.    Eyes: Negative.  Negative for pain, discharge and visual disturbance.  Respiratory:  Negative for cough, shortness of breath and wheezing.    Cardiovascular:  Negative for chest pain and palpitations. Leg swelling: ankle/feet.  Gastrointestinal:  Negative for abdominal pain, diarrhea, nausea and vomiting.   Endocrine: Negative for cold intolerance, heat intolerance and polydipsia.   Genitourinary:  Negative for difficulty urinating, dysuria, frequency and hematuria.   Musculoskeletal:  Negative for arthralgias.   Skin:  Negative for rash.   Neurological:  Negative for dizziness, weakness and headaches.   Hematological:  Negative for adenopathy.        PHYSICAL EXAMINATION  Vitals:    05/17/24 1058   BP: 132/75   Pulse: 51   Temp: 98.1 F (36.7 C)   SpO2: 98%   Weight: 88.6 kg (195 lb 6.4 oz)     Body mass index is 32.52 kg/m.    Physical Exam  Vitals reviewed.   Constitutional:       Appearance: Normal appearance.   HENT:      Head: Normocephalic and atraumatic.   Cardiovascular:      Rate and Rhythm: Normal rate and regular rhythm.   Pulmonary:      Effort: Pulmonary effort is normal.      Breath sounds: Normal breath sounds.   Abdominal:      General: Bowel sounds are normal.      Palpations: Abdomen is soft.   Musculoskeletal:         General: Normal range of motion.      Cervical back: Normal range of motion.   Skin:     General: Skin is warm.   Neurological:      General: No focal deficit present.      Mental Status: She is alert and oriented to person, place, and time. Mental status is at baseline.   Psychiatric:         Mood and Affect: Mood normal.         Behavior: Behavior normal.         Thought Content: Thought content normal.       MEDICATIONS at [facility] of Visit  Current Outpatient Medications on File Prior to Visit   Medication Sig Dispense Refill    Magnesium  Oxide (MAG-OX 400 PO) Take  1 tablet by mouth daily      ondansetron  (ZOFRAN ) 4 MG tablet Take 1 tablet by mouth 3 times daily as needed for Nausea or Vomiting for nausea or vomiting      Calcium  Carb-Cholecalciferol (OYSTER SHELL CALCIUM  W/D) 500-5 MG-MCG TABS tablet Take 2 tablets by mouth daily 180 tablet 3    ibandronate  (BONIVA ) 150 MG tablet TAKE 1 TABLET BY MOUTH EVERY DAY 3 tablet 3    Cholecalciferol (VITAMIN D3) 50 MCG (2000 UT) CAPS Take 2 capsules by mouth daily 180 capsule 3    calcium -vitamin D  (OSCAL-500) 500-5 MG-MCG TABS per tablet Take 2 tablets by mouth daily 180 tablet 3     No current facility-administered medications on file prior to visit.       ALLERGIES / INTOLERANCES  No Known Allergies    PERTINENT LABS AND IMAGING  Lab Results   Component Value Date/Time    WBC 4.1 09/14/2023 12:27 PM    HGB 13.6 09/14/2023 12:27 PM    PLT 202 09/14/2023 12:27 PM    MCV 95.6 09/14/2023 12:27 PM    NEUTROABS 2.2 09/14/2023 12:27 PM     Results  - Labs:    - CMP (10/29/2023): Electrolytes, kidney function, and liver  function tests were all normal. Creatinine: 0.65 mg/dL. Albumin: 4.1 g/dL. AST: 21 U/L. ALT: 22 U/L. GGT: 8 U/L.    - Lipid Profile (10/29/2023): Total cholesterol: 183 mg/dL. Triglycerides: 71 mg/dL. HDL: 51 mg/dL. LDL: 119 mg/dL.    - Uric Acid (10/29/2023): Normal  - Imaging:    - Bone Density Test: Stable/slightly better results    The patient (or guardian, if applicable) and other individuals in attendance with the patient were advised that Artificial Intelligence will be utilized during this visit to record, process the conversation to generate a clinical note, and support improvement of the AI technology. The patient (or guardian, if applicable) and other individuals in attendance at the appointment consented to the use of AI, including the recording.    "

## 2024-05-18 NOTE — Assessment & Plan Note (Signed)
"-   Managing with monthly facials and herbal treatments  - No topical medications currently used   "

## 2024-05-18 NOTE — Assessment & Plan Note (Signed)
"  Follow-up on vitamin D  levels  Tolerating Boniva  without difficulty will plan on continuing for 5 years  Continue weightbearing exercises, calcium  supplementation, vitamin D .  "

## 2024-05-18 NOTE — Assessment & Plan Note (Addendum)
"-   Poor sleep, averaging 4 to 7 hours per night  - Occasionally takes Advil PM when unable to sleep for multiple nights in a row (approximately once every two weeks)  - Usage is infrequent and not expected to have detrimental effects   - Denies any significant hot flashes at this time  "

## 2024-06-01 NOTE — Telephone Encounter (Signed)
"  Katelyn with imaging specialist called to request medical office notes on the pt. Please assist  Phone [phone]  Fax: [phone]  "

## 2024-06-02 NOTE — Telephone Encounter (Signed)
"  OFFICE NOTES FROM 06/18/2023 (MOST RECENT) WAS FAXED TO IMAGING SPECIALIST   "

## 2024-06-13 ENCOUNTER — Encounter: Payer: PRIVATE HEALTH INSURANCE | Attending: Physician Assistant | Primary: Internal Medicine

## 2024-07-21 NOTE — Telephone Encounter (Signed)
 Called and got the patient scheduled for Fri 07/22/2024 @ 11:00 am with Almarie

## 2024-07-21 NOTE — Telephone Encounter (Signed)
 She has had a head cold since the middle of december. Her symptoms started to get worse this week. She has been having a consistent productive cough, green mucus drainage, headache, body aches, chest congestion, stuffy nose, runny nose, head pressure, and post nasal drip.     She has been taking robitussin, cough medicine, nyquil cough medicine.     She has not tested for flu or covid. She does not have any in the home.     Please call patient to advise.

## 2024-07-22 ENCOUNTER — Ambulatory Visit
Admit: 2024-07-22 | Discharge: 2024-07-22 | Payer: PRIVATE HEALTH INSURANCE | Attending: Physician Assistant | Primary: Internal Medicine

## 2024-07-22 VITALS — BP 119/73 | HR 73 | Temp 99.80000°F | Ht 65.0 in | Wt 190.8 lb

## 2024-07-22 DIAGNOSIS — R059 Cough, unspecified: Principal | ICD-10-CM

## 2024-07-22 LAB — AMB POC COVID-19 & INFLUENZA A/B
INFLUENZA A RNA, POC: NOT DETECTED
INFLUENZA B RNA, POC: NOT DETECTED
SARS-COV-2 RNA, POC: NEGATIVE

## 2024-07-22 MED ORDER — AMOXICILLIN-POT CLAVULANATE 875-125 MG PO TABS
875-125 | ORAL_TABLET | Freq: Two times a day (BID) | ORAL | 0 refills | 9.00000 days | Status: AC
Start: 2024-07-22 — End: 2024-07-29

## 2024-07-22 MED ORDER — VITAMIN D3 50 MCG (2000 UT) PO CAPS
50 | ORAL_CAPSULE | Freq: Every day | ORAL | 3 refills | Status: AC
Start: 2024-07-22 — End: 2025-07-17

## 2024-07-22 NOTE — Progress Notes (Signed)
 CHIEF COMPLAINT:  Chief Complaint   Patient presents with    Cough    Congestion    Fatigue        HISTORY OF PRESENT ILLNESS:  Julie Garner is a 64 y.o. female  who presents today for sick visit.    Since the middle of December, she has felt she had mild cold symptoms.  She feels her symptoms are a new viral URI.  On Tuesday, her symptoms changed - cough (productive - green), body aches, subjective fever, post-nasal drip, HA, sinus pressure, diarrhea x1.  No known sick contacts.  She has taken Robitussin, Nyquil, Theraflu tea.      She has a history of Vitamin D  deficiency and osteopenia - she is taking 6000IU D3 daily.  She had labs done through work recently - she reports her vitamin D  level was WNL.      Denies sore throat, otalgia, CP, SOB, wheezing, edema, n/v, changes in bladder habits, skin changes.  No other questions or concerns.    PHQ:      07/22/2024    11:05 AM   PHQ-9    Little interest or pleasure in doing things 0   Feeling down, depressed, or hopeless 0   Trouble falling or staying asleep, or sleeping too much 1   Feeling tired or having little energy 1   Poor appetite or overeating 0   Feeling bad about yourself - or that you are a failure or have let yourself or your family down 0   Trouble concentrating on things, such as reading the newspaper or watching television 0   Moving or speaking so slowly that other people could have noticed. Or the opposite - being so fidgety or restless that you have been moving around a lot more than usual 0   Thoughts that you would be better off dead, or of hurting yourself in some way 0   PHQ-2 Score 0   PHQ-9 Total Score 2   If you checked off any problems, how difficult have these problems made it for you to do your work, take care of things at home, or get along with other people? 0       CURRENT MEDICATION LIST:    Current Outpatient Medications   Medication Sig Dispense Refill    Omega-3 Fatty Acids (FISH OIL PO) Take by mouth      vitamin D  (VITAMIN D3) 50  MCG (2000 UT) CAPS capsule Take 3 capsules by mouth daily 270 capsule 3    amoxicillin -clavulanate (AUGMENTIN ) 875-125 MG per tablet Take 1 tablet by mouth 2 times daily for 7 days 14 tablet 0    Magnesium  Oxide (MAG-OX 400 PO) Take 1 tablet by mouth daily      Calcium  Carb-Cholecalciferol (OYSTER SHELL CALCIUM  W/D) 500-5 MG-MCG TABS tablet Take 2 tablets by mouth daily 180 tablet 3    ibandronate  (BONIVA ) 150 MG tablet TAKE 1 TABLET BY MOUTH EVERY DAY 3 tablet 3     No current facility-administered medications for this visit.        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  Past Surgical History:   Procedure Laterality Date    MOHS SURGERY  2018      Social History     Socioeconomic History    Marital status: Married     Spouse name: Not on file    Number of children: Not on file    Years of education: Not on file    Highest education level: Not on file   Occupational History    Not on file   Tobacco Use    Smoking status: Never    Smokeless tobacco: Never   Vaping Use    Vaping status: Never Used   Substance and Sexual Activity    Alcohol use: Not Currently    Drug use: Not Currently    Sexual activity: Yes     Partners: Male   Other Topics Concern    Not on file   Social History Narrative    Not on file     Social Drivers of Health     Financial Resource Strain: Not on file   Food Insecurity: Not on file   Transportation Needs: Not on file   Physical Activity: Not on file   Stress: Not on file   Social Connections: Not on file   Intimate Partner Violence: Not on file   Housing Stability: Not on file      Family History   Problem Relation Age of Onset    Breast Cancer Mother 1        deceased at 39 yo    Macular  Degen Father     Diabetes type 2  Father     Heart Failure Father     Skin Cancer Father         [facility]    Gout Father 46 - 86    Diabetes Father 59 - 61    High Cholesterol Father 50 - 53    Hearing Loss Father 30 - 99    High Blood Pressure Father 45 - 46    Vision Loss Father 3 - 8    Breast Cancer Sister 58        deceased at 41 yo    Breast Cancer Sister 32        ER+    High Cholesterol Sister 61 - 83    Cancer Maternal Grandmother         unknown    Bone Cancer Maternal Grandfather     Cerebral Aneurysm Paternal Grandmother     Cancer Paternal Grandfather     Breast Cancer Maternal Aunt 8    Brain Cancer Paternal Uncle     Breast Cancer Maternal Aunt         REVIEW OF SYSTEMS:  Pertinent items are noted in HPI.    PHYSICAL EXAM:  Vital Signs -   Visit Vitals  BP 119/73   Pulse 73   Temp 99.8 F (37.7 C)   Ht 1.651 m (5' 5)   Wt 86.5 kg (190 lb 12.8 oz)   SpO2 98%   BMI 31.75 kg/m    Body mass index is 31.75 kg/m.   General Appearance:  awake, alert, oriented, in no acute distress and well developed, well nourished  Head/face: Normocephalic and atraumatic  Eyes:  EOMI and Sclera nonicteric  Nose/Sinuses:  Nares normal. Septum midline. Mucosa normal. No drainage   Mouth/Throat:  Mucosa moist.  No lesions.  Pharynx without edema or exudate.  Mild erythema.    Lungs:  Normal expansion.  Clear to auscultation.  No rales, rhonchi, or wheezing.  Heart:  Heart sounds are normal.  Regular rate and rhythm without murmur, gallop or rub.  Abdomen:  Soft, non-tender, non-distended.  Extremities: Extremities warm to touch, pink, with no edema, and pulses present in all extremities  Musculoskeletal:  Range of motion normal in hips, knees, shoulders, and spine.  Neuro:  CN II-XII grossly intact.  Sensation intact.  Gait WNL.  Psych:  Alert and oriented, appropriate affect      LABS  Results for orders placed or performed in visit on 07/22/24   AMB POC COVID-19 & Influenza A/B   Result Value Ref Range    SARS-COV-2 RNA,  POC Negative     INFLUENZA A RNA, POC Not-Detected     INFLUENZA B RNA, POC Not-Detected     Internal Control      Lot number swab      EXP date swab      LOT NUMBER POC      EXPIRATION DATE      Lot number solution      EXP date solution       Orders Only on 09/14/2023   Component Date Value Ref Range Status    WBC 09/14/2023 4.1  3.8 - 10.6 x10e3/mcL Final    RBC 09/14/2023 4.34  3.60 - 5.20 x10e6/mcL Final    Hemoglobin 09/14/2023 13.6  11.5 - 15.7 g/dL Final    Hematocrit 96/82/7974 41.5  34.0 - 47.0 % Final    MCV 09/14/2023 95.6  81.0 - 99.0 fL Final    MCH 09/14/2023 31.3  27.0 - 34.5 pg Final    MCHC 09/14/2023 32.8  30.0 - 36.0 g/dL Final    RDW 96/82/7974 13.1  10.0 - 17.0 % Final    Platelets 09/14/2023 202  140 - 440 x10e3/mcL Final    MPV 09/14/2023 11.9  7.0 - 12.2 fL Final    NRBC Automated 09/14/2023 0.0  0.0 - 0.2 % Final    NRBC Absolute 09/14/2023 0.000  0.000 - 0.012 x10e3/mcL Final    Neutrophils % 09/14/2023 54.7  42.0 - 74.0 % Final    Lymphocytes 09/14/2023 28.4  15.0 - 45.0 % Final    Monocytes % 09/14/2023 14.2 (H)  4.0 - 12.0 % Final    Eosinophils % 09/14/2023 2.0  0.0 - 7.0 % Final    Basophils % 09/14/2023 0.5  0.0 - 2.0 % Final    Neutrophils Absolute 09/14/2023 2.2  1.6 - 7.3 x10e3/mcL Final    Lymphocytes Absolute 09/14/2023 1.2  1.0 - 3.2 x10e3/mcL Final    Monocytes Absolute 09/14/2023 0.6  0.3 - 1.0 x10e3/mcL Final    Eosinophils Absolute 09/14/2023 0.1  0.0 - 0.5 x10e3/mcL Final    Basophils Absolute 09/14/2023 0.0  0.0 - 0.2 x10e3/mcL Final    Immature Granulocytes % 09/14/2023 0.2  0.0 - 0.6 % Final    Immature Grans (Abs) 09/14/2023 0.01  0.00 - 0.06 x10e3/mcL Final    Sodium 09/14/2023 139  135 - 145 mmol/L Final    Potassium 09/14/2023 3.8  3.5 - 5.3 mmol/L Final    Chloride 09/14/2023 104  98 - 107 mmol/L Final    CO2 09/14/2023 24  22 - 29 mmol/L Final    Glucose 09/14/2023 92  70 - 99 mg/dL Final    BUN 96/82/7974 7 (L)  8 - 23 mg/dL Final    Creatinine 96/82/7974 0.6   0.5 - 1.0 mg/dL Final  Anion Gap 09/14/2023 11  2 - 17 mmol/L Final    Osmolaliy Calculated 09/14/2023 275  270 - 287 mOsm/kg Final    Calcium  09/14/2023 8.7  8.5 - 10.7 mg/dL Final    Total Protein 09/14/2023 6.6  5.7 - 8.3 g/dL Final    Albumin 96/82/7974 3.6  3.5 - 5.2 g/dL Final    Globulin 96/82/7974 3.0  1.9 - 4.4 g/dL Final    Albumin/Globulin Ratio 09/14/2023 1.20  1.00 - 2.70 Final    Total Bilirubin 09/14/2023 0.19  0.00 - 1.20 mg/dL Final    Alk Phosphatase 09/14/2023 61  35 - 117 unit/L Final    AST 09/14/2023 25  0 - 46 unit/L Final    ALT 09/14/2023 20  0 - 42 unit/L Final    Est, Glom Filt Rate 09/14/2023 100  >=60 mL/min/1.12m Final    Comment: VERIFIED by Discern Expert.  GFR Interpretation:                                                                         % OF  KIDNEY  GFR                                                        STAGE  FUNCTION  ==================================================================================    > 90        Normal kidney function                       STAGE 1  90-100%  89 to 60      Mild loss of kidney function                 STAGE 2  80-60%  59 to 45      Mild to moderate loss of kidney function     STAGE 3a  59-45%  44 to 30      Moderate to severe loss of kidney function   STAGE 3b  44-30%  29 to 15      Severe loss of kidney function               STAGE 4  29-15%    < 15        Kidney failure                               STAGE 5  <15%  ==================================================================================  Modified from [employer]    GFR Calculation performed using the CKD-EPI 2021 equation developed for use  with IDMS traceable creatinine methods and                            is the calculation recommended by  the [facility] for estimating GFR in adults.      Lipase 09/14/2023 21  13 - 60 unit/L Final    Comment: Effective 04/28/15 Lipase Methodology Change - Lipase Reference Range is  specific for the  Roche Enzymatic colorimetric method.    Reference range changed on 07/27/15 from 3-300 unit/L to 13-60 unit/L      Color, UA 09/14/2023 Yellow   Final    Appearance 09/14/2023 Clear   Final    Glucose, Ur 09/14/2023 Negative  Negative Final    Bilirubin, Urine 09/14/2023 Negative  Negative Final    Ketones, Urine 09/14/2023 15 (A)  Negative mg/dL Final    Specific Gravity, UA 09/14/2023 1.010  1.003 - 1.035 Final    Blood, Urine 09/14/2023 Negative  Negative Final    pH, Urine 09/14/2023 6.0  4.5 - 8.0 Final    Protein, UA 09/14/2023 Negative  Negative Final    Urobilinogen, Urine 09/14/2023 0.2  0.2 - 1.0 EU/dL Final    Nitrite, Urine 09/14/2023 Negative  Negative Final    Leukocyte Esterase, Urine 09/14/2023 Negative  Negative Final       IMPRESSION/PLAN    1. Localized osteoporosis with current pathological fracture with routine healing, subsequent encounter  Continue Vitamin D  6000IU D3 daily.  Adequate calcium  intake.  Weight bearing exercises.      2. Vitamin D  deficiency  Continue Vitamin D  6000IU D3 daily.      3. Cough, unspecified type  Negative for COVID/Flu in office.  Symptoms due to a new viral infection vs progression of symptoms over the past month?  Encouraged symptomatic treatment - hydration, humidification, saline nasal rinses, INGC, antihistamine, Mucinex-DM PRN, APAP PRN, etc.  If no improvement or new symptoms start after at least week total of symptoms, start ABX.  Should she need ABX - take with food, take a probiotic.  She declines prescription cough medication at this time.  - AMB POC COVID-19 & Influenza A/B    4. Chest congestion  See above.  - AMB POC COVID-19 & Influenza A/B         Follow up and Dispositions:    Return if symptoms worsen or fail to improve.       Julie JONELLE Potters, PA-C

## 2024-07-26 ENCOUNTER — Ambulatory Visit
Admit: 2024-07-26 | Discharge: 2024-07-26 | Payer: PRIVATE HEALTH INSURANCE | Attending: Physician Assistant | Primary: Internal Medicine

## 2024-07-26 VITALS — Ht 65.0 in

## 2024-07-26 DIAGNOSIS — Z803 Family history of malignant neoplasm of breast: Principal | ICD-10-CM

## 2024-07-26 NOTE — Progress Notes (Signed)
 BREAST SURGERY FOLLOW UP VISIT    01/22/2022    CHIEF COMPLAINT:  High risk for future breast cancer.    HISTORY OF PRESENT ILLNESS:  Julie Garner is a 64 y.o. presents for high risk follow up. Patient was initially referred by Dr. Levern who requested that I evaluate her for her elevated risk for future breast cancer development.   She presents today to discuss her elevated risk for breast cancer and the best mechanism for surveillance and/or prevention.  The patient states that she herself has not noticed any abnormal masses in either breast.  She denies any change in the appearance of her breasts or the skin of her breasts.  She denies bilateral nipple discharge.  She has no other systemic complaints and otherwise feels well today.     Family History:  Mother was diagnosed with breast cancer at 21 yo  Sister diagnosed with breast cancer at 55 yo  Sister diagnosed with breast cancer at 13 yo  Maternal aunt diagnosed with breast cancer at 58 yo  Possible paternal aunts with breast cancer.      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     Past Surgical History:   Procedure Laterality Date    MOHS SURGERY  2018     Current Outpatient Medications   Medication Sig Dispense Refill    Omega-3 Fatty Acids (FISH OIL PO) Take by mouth      vitamin D  (VITAMIN D3) 50 MCG (2000 UT) CAPS capsule Take 3 capsules by mouth daily 270 capsule 3    amoxicillin -clavulanate (AUGMENTIN ) 875-125 MG per tablet Take 1 tablet by mouth 2 times daily for 7 days 14 tablet 0    Magnesium  Oxide (MAG-OX 400 PO) Take 1 tablet by mouth daily      Calcium  Carb-Cholecalciferol (OYSTER SHELL CALCIUM  W/D) 500-5 MG-MCG TABS tablet Take 2 tablets by mouth daily 180 tablet 3    ibandronate  (BONIVA ) 150 MG tablet TAKE 1 TABLET BY MOUTH EVERY DAY 3 tablet 3     No current facility-administered medications for this visit.     No Known Allergies  Social History     Socioeconomic History    Marital status: Married     Spouse name: Not on file    Number of children: Not on file    Years of education: Not on file    Highest education level: Not on file   Occupational History  Not on file   Tobacco Use    Smoking status: Never    Smokeless tobacco: Never   Vaping Use    Vaping status: Never Used   Substance and Sexual Activity    Alcohol use: Not Currently    Drug use: Not Currently    Sexual activity: Yes     Partners: Male   Other Topics Concern    Not on file   Social History Narrative    Not on file     Social Drivers of Health     Financial Resource Strain: Not on file   Food Insecurity: Not on file   Transportation Needs: Not on file   Physical Activity: Not on file   Stress: Not on file   Social Connections: Not on file   Intimate Partner Violence: Not on file   Housing Stability: Not on file     Family History   Problem Relation Age of Onset    Breast Cancer Mother 49        deceased at 55 yo    Macular Degen Father     Diabetes type 2  Father     Heart Failure Father     Skin Cancer  Father         [facility]    Gout Father 45 - 42    Diabetes Father 8 - 25    High Cholesterol Father 50 - 73    Hearing Loss Father 63 - 99    High Blood Pressure Father 30 - 40    Vision Loss Father 32 - 23    Breast Cancer Sister 66        deceased at 69 yo    Breast Cancer Sister 82        ER+    High Cholesterol Sister 15 - 75    Cancer Maternal Grandmother         unknown    Bone Cancer Maternal Grandfather     Cerebral Aneurysm Paternal Grandmother     Cancer Paternal Grandfather     Breast Cancer Maternal Aunt 45    Brain Cancer Paternal Uncle     Breast Cancer Maternal Aunt        Review of Systems    General/Constitutional:           Fever denies.  Chills denies.  Headache denies.  Fatigue denies.         Breast:           Breast lump denies.  Breast pain denies.  Nipple discharge denies.  Red skin denies.       Skin:           Rash denies.  Skin cancer denies.  Skin lesion(s) denies.           PHYSICAL EXAMINATION:   Ht 1.651 m (5' 5)   BMI 31.75 kg/m   General Examination:         GENERAL APPEARANCE: well developed, well nourished, Patient is alert and oriented X 3. No acute distress. Appropriate affect, looks stated age.          HEAD: normocephalic, atraumatic.          NECK: neck supple, full range of motion.          LYMPH NODES:  no axillary, supraclavicular or cervical adenopathy.            BREASTS:  The patient was examined in the supine and upright positions.  RIGHT Breast: the skin, nipple and areola are normal. No nipple discharge was able to be expressed today. There are no retractions or dimpling noted. There is generalized nodularity without a dominant mass. The axillary tail is normal.                     LEFT Breast: the skin, nipple and areola are normal. No nipple discharge was able to be expressed today. There are no retractions or dimpling noted. There is generalized nodularity without a dominant mass. The axillary tail is normal.          SKIN: good turgor, no rashes  noted, warm and dry.          MUSCULOSKELETAL: full range of motion, no swelling or deformity.          EXTREMITIES: no edema, clubbing or cyanosis.         MRI Result (most recent):        --------------------------------------------    Mammogram Result:         GENETICS:      RISK ASSESSMENT:  TCV8          Assessment:    ICD-10-CM    1. Family history of breast cancer  Z80.3 MRI BREAST BILATERAL W WO CONTRAST      2. At high risk for breast cancer  Z91.89 MRI BREAST BILATERAL W WO CONTRAST      3. Scattered fibroglandular tissue density of both breasts on mammography  R92.323 MRI BREAST BILATERAL W WO CONTRAST      4. Other screening mammogram  Z12.31 MAM TOMO DIGITAL SCREEN BILATERAL            IMPRESSION/RECOMMENDATION:    Julie Garner is a 64 y.o. woman who presents today for follow up regarding her elevated risk for future breast cancer development. Her exam today and recent MRI show no evidence of breast cancer.    The [employer] Risk app was utilized to calculate her breast cancer risk. Her five- year Alisa risk is 5.3% and her lifetime risk TCV 6 20.3%, TCV7 30.9%, TCV8 18.7%, BRCAPRO 14.1%, Claus 14.2%, Gail 22.0%.     At this time, she does meet criteria for chemoprevention using medications for risk reduction. She declined referral to medical oncology for further discussion previously.    With her LTR > 20% she does meet qualifications for high risk imaging surveillance to include annual mammogram, annual breast MRI and two CBEs annually. We discussed the false positives of breast MRI that require further work up with targeted breast US  with possible biopsy.     Summary:  To summarize, I recommend two breast exams per year, annual mammogram and annual MRI. She was instructed to call if she notes changes in her exam.     MRI - plan for bilateral breast MRI 05/2025 with CBE a few days later  Mammogram - plan for 12/2024, reach out with results  Patient will consider chemoprevention, call if she wants referral  to medical oncology  Continue breast surveillance    I spent a total of 20 minutes reviewing the records, imaging studies, reports and counseling the patient regarding the recommended treatment plan for her breast care and documenting in patient record.     Vernell Abler, PA-C
